# Patient Record
Sex: Female | Born: 1990 | Race: Black or African American | Hispanic: No | Marital: Single | Smoking: Former smoker
Health system: Southern US, Community
[De-identification: ages and names within clinical notes are randomized; demographics above are authoritative.]

## PROBLEM LIST (undated history)

## (undated) DIAGNOSIS — I10 Essential (primary) hypertension: Secondary | ICD-10-CM

## (undated) DIAGNOSIS — E039 Hypothyroidism, unspecified: Secondary | ICD-10-CM

## (undated) DIAGNOSIS — E119 Type 2 diabetes mellitus without complications: Secondary | ICD-10-CM

## (undated) HISTORY — PX: FEMUR SURGERY: SHX943

## (undated) HISTORY — PX: HIP FRACTURE SURGERY: SHX118

---
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
## 2010-08-16 LAB — COMPREHENSIVE METABOLIC PANEL
AST: 17 U/L (ref 0–37)
Albumin: 4.2 g/dL (ref 3.5–5.2)
Alkaline Phosphatase: 83 U/L (ref 39–117)
BUN: 4 mg/dL — ABNORMAL LOW (ref 6–23)
CO2: 27 mEq/L (ref 19–32)
Chloride: 105 mEq/L (ref 96–112)
Creatinine, Ser: 0.6 mg/dL (ref 0.4–1.2)
GFR calc non Af Amer: >60 mL/min (ref >60)
Potassium: 3.9 mEq/L (ref 3.5–5.1)
Total Bilirubin: 0.4 mg/dL (ref 0.3–1.2)

## 2010-08-16 LAB — PREGNANCY, URINE

## 2010-08-16 LAB — CBC
HCT: 39 % (ref 36.0–46.0)
Hemoglobin: 13.1 g/dL (ref 12.0–15.0)
Platelets: 272 10*3/uL (ref 150–400)
WBC: 12.5 10*3/uL — ABNORMAL HIGH (ref 4.0–10.5)

## 2010-08-16 LAB — URINALYSIS, ROUTINE W REFLEX MICROSCOPIC
Bilirubin Urine: NEGATIVE
Ketones, ur: NEGATIVE mg/dL
Nitrite: NEGATIVE
Protein, ur: NEGATIVE mg/dL

## 2010-08-16 LAB — DIFFERENTIAL
Monocytes Absolute: 0.9 10*3/uL (ref 0.1–1.0)
Monocytes Relative: 7 % (ref 3–12)
Neutro Abs: 9.3 10*3/uL — ABNORMAL HIGH (ref 1.7–7.7)

## 2010-08-16 LAB — WET PREP, GENITAL

## 2010-08-16 LAB — LIPASE, BLOOD: Lipase: 34 U/L (ref 23–300)

## 2010-08-20 LAB — URINALYSIS, ROUTINE W REFLEX MICROSCOPIC
Nitrite: NEGATIVE
Specific Gravity, Urine: 1.01 (ref 1.005–1.030)
Urobilinogen, UA: 0.2 mg/dL (ref 0.0–1.0)

## 2010-08-20 LAB — WET PREP, GENITAL

## 2010-08-20 LAB — GC/CHLAMYDIA PROBE AMP, GENITAL

## 2010-08-20 LAB — PREGNANCY, URINE

## 2010-08-20 LAB — URINE MICROSCOPIC-ADD ON

## 2011-05-30 ENCOUNTER — Encounter (HOSPITAL_BASED_OUTPATIENT_CLINIC_OR_DEPARTMENT_OTHER): Payer: Self-pay

## 2011-05-30 ENCOUNTER — Emergency Department (HOSPITAL_BASED_OUTPATIENT_CLINIC_OR_DEPARTMENT_OTHER)
Admission: EM | Admit: 2011-05-30 | Discharge: 2011-05-30 | Disposition: A | Discharge status: Home / Self Care | Payer: Medicaid Other | Attending: Emergency Medicine | Admitting: Emergency Medicine

## 2011-05-30 DIAGNOSIS — R300 Dysuria: Secondary | ICD-10-CM | POA: Insufficient documentation

## 2011-05-30 DIAGNOSIS — R319 Hematuria, unspecified: Secondary | ICD-10-CM | POA: Insufficient documentation

## 2011-05-30 DIAGNOSIS — N390 Urinary tract infection, site not specified: Secondary | ICD-10-CM | POA: Insufficient documentation

## 2011-05-30 DIAGNOSIS — F17200 Nicotine dependence, unspecified, uncomplicated: Secondary | ICD-10-CM | POA: Insufficient documentation

## 2011-05-30 LAB — URINALYSIS, ROUTINE W REFLEX MICROSCOPIC
Protein, ur: 30 mg/dL — AB
Urobilinogen, UA: 0.2 mg/dL (ref 0.0–1.0)

## 2011-05-30 LAB — URINE MICROSCOPIC-ADD ON

## 2011-05-30 LAB — PREGNANCY, URINE: Preg Test, Ur: NEGATIVE

## 2011-05-30 MED ORDER — CIPROFLOXACIN HCL 500 MG PO TABS
500.0000 mg | ORAL_TABLET | Freq: Once | ORAL | Status: AC
  Administered 2011-05-30: 500 mg via ORAL
  Filled 2011-05-30: qty 1

## 2011-05-30 MED ORDER — PHENAZOPYRIDINE HCL 100 MG PO TABS
100.0000 mg | ORAL_TABLET | Freq: Once | ORAL | Status: AC
  Administered 2011-05-30: 100 mg via ORAL
  Filled 2011-05-30: qty 1

## 2011-05-30 MED ORDER — PHENAZOPYRIDINE HCL 200 MG PO TABS: 200.0000 mg | ORAL_TABLET | Freq: Three times a day (TID) | ORAL | Status: AC

## 2011-05-30 MED ORDER — CIPROFLOXACIN HCL 500 MG PO TABS: 500.0000 mg | ORAL_TABLET | Freq: Two times a day (BID) | ORAL | Status: AC

## 2011-05-30 NOTE — Discharge Instructions (Signed)
Urinary Tract Infection Infections of the urinary tract can start in several places. A bladder infection (cystitis), a kidney infection (pyelonephritis), and a prostate infection (prostatitis) are different types of urinary tract infections (UTIs). They usually get better if treated with medicines (antibiotics) that kill germs. Take all the medicine until it is gone. You or your child may feel better in a few days, but TAKE ALL MEDICINE or the infection may not respond and may become more difficult to treat. HOME CARE INSTRUCTIONS   Drink enough water and fluids to keep the urine clear or pale yellow. Cranberry juice is especially recommended, in addition to large amounts of water.   Avoid caffeine, tea, and carbonated beverages. They tend to irritate the bladder.   Alcohol may irritate the prostate.   Only take over-the-counter or prescription medicines for pain, discomfort, or fever as directed by your caregiver.  To prevent further infections:  Empty the bladder often. Avoid holding urine for long periods of time.   After a bowel movement, women should cleanse from front to back. Use each tissue only once.   Empty the bladder before and after sexual intercourse.  FINDING OUT THE RESULTS OF YOUR TEST Not all test results are available during your visit. If your or your child's test results are not back during the visit, make an appointment with your caregiver to find out the results. Do not assume everything is normal if you have not heard from your caregiver or the medical facility. It is important for you to follow up on all test results. SEEK MEDICAL CARE IF:   There is back pain.   Your baby is older than 3 months with a rectal temperature of 100.5 F (38.1 C) or higher for more than 1 day.   Your or your child's problems (symptoms) are no better in 3 days. Return sooner if you or your child is getting worse.  SEEK IMMEDIATE MEDICAL CARE IF:   There is severe back pain or lower  abdominal pain.   You or your child develops chills.   You have a fever.   Your baby is older than 3 months with a rectal temperature of 102 F (38.9 C) or higher.   Your baby is 3 months old or younger with a rectal temperature of 100.4 F (38 C) or higher.   There is nausea or vomiting.   There is continued burning or discomfort with urination.  MAKE SURE YOU:   Understand these instructions.   Will watch your condition.   Will get help right away if you are not doing well or get worse.  Document Released: 02/08/2005 Document Revised: 01/11/2011 Document Reviewed: 09/13/2006 Exit[employer] Patient Information 2012 [employer].Urinary Tract Infection Infections of the urinary tract can start in several places. A bladder infection (cystitis), a kidney infection (pyelonephritis), and a prostate infection (prostatitis) are different types of urinary tract infections (UTIs). They usually get better if treated with medicines (antibiotics) that kill germs. Take all the medicine until it is gone. You or your child may feel better in a few days, but TAKE ALL MEDICINE or the infection may not respond and may become more difficult to treat. HOME CARE INSTRUCTIONS   Drink enough water and fluids to keep the urine clear or pale yellow. Cranberry juice is especially recommended, in addition to large amounts of water.   Avoid caffeine, tea, and carbonated beverages. They tend to irritate the bladder.   Alcohol may irritate the prostate.   Only take   over-the-counter or prescription medicines for pain, discomfort, or fever as directed by your caregiver.  To prevent further infections:  Empty the bladder often. Avoid holding urine for long periods of time.   After a bowel movement, women should cleanse from front to back. Use each tissue only once.   Empty the bladder before and after sexual intercourse.  FINDING OUT THE RESULTS OF YOUR TEST Not all test results are available during your  visit. If your or your child's test results are not back during the visit, make an appointment with your caregiver to find out the results. Do not assume everything is normal if you have not heard from your caregiver or the medical facility. It is important for you to follow up on all test results. SEEK MEDICAL CARE IF:   There is back pain.   Your baby is older than 3 months with a rectal temperature of 100.5 F (38.1 C) or higher for more than 1 day.   Your or your child's problems (symptoms) are no better in 3 days. Return sooner if you or your child is getting worse.  SEEK IMMEDIATE MEDICAL CARE IF:   There is severe back pain or lower abdominal pain.   You or your child develops chills.   You have a fever.   Your baby is older than 3 months with a rectal temperature of 102 F (38.9 C) or higher.   Your baby is 3 months old or younger with a rectal temperature of 100.4 F (38 C) or higher.   There is nausea or vomiting.   There is continued burning or discomfort with urination.  MAKE SURE YOU:   Understand these instructions.   Will watch your condition.   Will get help right away if you are not doing well or get worse.  Document Released: 02/08/2005 Document Revised: 01/11/2011 Document Reviewed: 09/13/2006 Exit[employer] Patient Information 2012 [employer]. 

## 2011-05-30 NOTE — ED Notes (Signed)
C/o dysuria/hematuria started this am

## 2011-05-30 NOTE — ED Provider Notes (Signed)
History     CSN: [id number]  Arrival date & time 05/30/11  1038   First MD Initiated Contact with Patient 05/30/11 1117      Chief Complaint  Patient presents with  . Dysuria  . Hematuria    (Consider location/radiation/quality/duration/timing/severity/associated sxs/prior treatment) HPI Comments: Patient presents with dysuria since yesterday.  She is noted a small amount of blood in her urine and a cloudy appearance to her urine.  She notes that it burned each time she urinates.  She assistant increased urgency and frequency.  She denies any abdominal pain, nausea, vomiting, flank pain or fevers.  She's not taking any medications at home for this.  There's no specific inciting or relieving factors.  Patient is a 21 y.o. female presenting with dysuria and hematuria. The history is provided by the patient. No language interpreter was used.  Dysuria  This is a new problem. The current episode started yesterday. The problem occurs every urination. The problem has not changed since onset.The quality of the pain is described as burning. The pain is mild. There has been no fever. She is sexually active. There is no history of pyelonephritis. Associated symptoms include frequency, hematuria and urgency. Pertinent negatives include no chills, no sweats, no nausea, no vomiting, no discharge, no hesitancy, no possible pregnancy and no flank pain.  Hematuria Irritative symptoms include frequency and urgency. Associated symptoms include dysuria. Pertinent negatives include no abdominal pain, chills, fever, flank pain, hesitancy, nausea or vomiting.    History reviewed. No pertinent past medical history.  History reviewed. No pertinent past surgical history.  History reviewed. No pertinent family history.  History  Substance Use Topics  . Smoking status: Current Everyday Smoker  . Smokeless tobacco: Not on file  . Alcohol Use: Yes    OB History    Grav Para Term Preterm Abortions TAB SAB  Ect Mult Living                  Review of Systems  Constitutional: Negative.  Negative for fever and chills.  HENT: Negative.   Eyes: Negative.  Negative for discharge and redness.  Respiratory: Negative.  Negative for cough and shortness of breath.   Cardiovascular: Negative.  Negative for chest pain.  Gastrointestinal: Negative.  Negative for nausea, vomiting, abdominal pain and diarrhea.  Genitourinary: Positive for dysuria, urgency, frequency and hematuria. Negative for hesitancy, flank pain, vaginal bleeding, vaginal discharge and vaginal pain.  Musculoskeletal: Negative.  Negative for back pain.  Skin: Negative.  Negative for color change and rash.  Neurological: Negative.  Negative for syncope and headaches.  Hematological: Negative.  Negative for adenopathy.  Psychiatric/Behavioral: Negative.  Negative for confusion.  All other systems reviewed and are negative.    Allergies  Review of patient's allergies indicates no known allergies.  Home Medications  No current outpatient prescriptions on file.  BP 127/79  Pulse 78  Temp(Src) 98.7 F (37.1 C) (Oral)  Resp 16  Ht 5\' 2"  (1.575 m)  Wt 98 lb (44.453 kg)  BMI 17.92 kg/m2  SpO2 100%  LMP 05/04/2011  Physical Exam  Constitutional: She is oriented to person, place, and time. She appears well-developed and well-nourished.  Non-toxic appearance. She does not have a sickly appearance.  HENT:  Head: Normocephalic and atraumatic.  Eyes: Conjunctivae, EOM and lids are normal. Pupils are equal, round, and reactive to light. No scleral icterus.  Neck: Trachea normal and normal range of motion. Neck supple.  Cardiovascular: Normal rate, regular rhythm and  normal heart sounds.   Pulmonary/Chest: Effort normal and breath sounds normal. No respiratory distress. She has no wheezes. She has no rales.  Abdominal: Soft. Normal appearance. She exhibits no distension. There is no tenderness. There is no rebound, no guarding and no  CVA tenderness.  Genitourinary:       No CVA tenderness bilaterally  Musculoskeletal: Normal range of motion.  Neurological: She is alert and oriented to person, place, and time. She has normal strength.  Skin: Skin is warm, dry and intact. No rash noted.  Psychiatric: She has a normal mood and affect. Her behavior is normal. Judgment and thought content normal.    ED Course  Procedures (including critical care time)  Results for orders placed during the hospital encounter of 05/30/11  URINALYSIS, ROUTINE W REFLEX MICROSCOPIC      Component Value Range   Color, Urine YELLOW  YELLOW    APPearance CLEAR  CLEAR    Specific Gravity, Urine 1.007  1.005 - 1.030    pH 7.0  5.0 - 8.0    Glucose, UA NEGATIVE  NEGATIVE (mg/dL)   Hgb urine dipstick LARGE (*) NEGATIVE    Bilirubin Urine NEGATIVE  NEGATIVE    Ketones, ur NEGATIVE  NEGATIVE (mg/dL)   Protein, ur 30 (*) NEGATIVE (mg/dL)   Urobilinogen, UA 0.2  0.0 - 1.0 (mg/dL)   Nitrite NEGATIVE  NEGATIVE    Leukocytes, UA SMALL (*) NEGATIVE   PREGNANCY, URINE      Component Value Range   Preg Test, Ur NEGATIVE    URINE MICROSCOPIC-ADD ON      Component Value Range   Squamous Epithelial / LPF FEW (*) RARE    WBC, UA 21-50  <3 (WBC/hpf)   RBC / HPF 11-20  <3 (RBC/hpf)   Bacteria, UA FEW (*) RARE       MDM  Patient with urinary tract infection without signs of pyelonephritis.  Patient will be started on a three-day course of ciprofloxacin and given Pyridium to assist with her symptoms.  Patient does have followup with her primary care physician as needed and understands she's not improving she should seek reevaluation.        Nat Christen, MD 05/30/11 1128

## 2011-11-02 ENCOUNTER — Emergency Department (HOSPITAL_BASED_OUTPATIENT_CLINIC_OR_DEPARTMENT_OTHER)
Admission: EM | Admit: 2011-11-02 | Discharge: 2011-11-02 | Disposition: A | Discharge status: Home / Self Care | Payer: Medicaid Other | Attending: Emergency Medicine | Admitting: Emergency Medicine

## 2011-11-02 ENCOUNTER — Encounter (HOSPITAL_BASED_OUTPATIENT_CLINIC_OR_DEPARTMENT_OTHER): Payer: Self-pay | Admitting: *Deleted

## 2011-11-02 DIAGNOSIS — J069 Acute upper respiratory infection, unspecified: Secondary | ICD-10-CM | POA: Insufficient documentation

## 2011-11-02 DIAGNOSIS — F17200 Nicotine dependence, unspecified, uncomplicated: Secondary | ICD-10-CM | POA: Insufficient documentation

## 2011-11-02 LAB — RAPID STREP SCREEN (MED CTR MEBANE ONLY): Streptococcus, Group A Screen (Direct): NEGATIVE

## 2011-11-02 MED ORDER — AZITHROMYCIN 250 MG PO TABS: 250.0000 mg | ORAL_TABLET | Freq: Every day | ORAL | Status: AC

## 2011-11-02 MED ORDER — HYDROCOD POLST-CHLORPHEN POLST 10-8 MG/5ML PO LQCR: 5.0000 mL | Freq: Two times a day (BID) | ORAL | Status: DC | PRN

## 2011-11-02 NOTE — ED Notes (Signed)
Patient states she has a two week history of sinus congestion and drainage, associated with productive cough with green secretions, fever and sore throat.

## 2011-11-02 NOTE — ED Provider Notes (Signed)
History     CSN: [id number]  Arrival date & time 11/02/11  1121   First MD Initiated Contact with Patient 11/02/11 1155      Chief Complaint  Patient presents with  . URI    (Consider location/radiation/quality/duration/timing/severity/associated sxs/prior treatment) Patient is a 21 y.o. female presenting with URI. The history is provided by the patient.  URI The primary symptoms include fever, fatigue, ear pain, sore throat, swollen glands and cough. Episode onset: 2 weeks ago and worsening. This is a new problem. The problem has been gradually worsening.  Symptoms associated with the illness include chills, plugged ear sensation, facial pain, sinus pressure and congestion.    History reviewed. No pertinent past medical history.  History reviewed. No pertinent past surgical history.  No family history on file.  History  Substance Use Topics  . Smoking status: Current Everyday Smoker -- 1.5 packs/day for 3 years    Types: Cigarettes  . Smokeless tobacco: Not on file  . Alcohol Use: Yes    OB History    Grav Para Term Preterm Abortions TAB SAB Ect Mult Living                  Review of Systems  Constitutional: Positive for fever, chills and fatigue.  HENT: Positive for ear pain, congestion, sore throat and sinus pressure.   Respiratory: Positive for cough.   All other systems reviewed and are negative.    Allergies  Review of patient's allergies indicates no known allergies.  Home Medications  No current outpatient prescriptions on file.  BP 131/81  Pulse 99  Temp 98.7 F (37.1 C) (Oral)  Resp 16  SpO2 100%  LMP 10/18/2011  Physical Exam  Nursing note and vitals reviewed. Constitutional: She is oriented to person, place, and time. She appears well-developed and well-nourished. No distress.  HENT:  Head: Normocephalic and atraumatic.       Bilateral tm's have fluid and erythema  Neck: Normal range of motion. Neck supple.  Cardiovascular: Normal rate  and regular rhythm.   No murmur heard. Pulmonary/Chest: Effort normal and breath sounds normal. No respiratory distress. She has no wheezes.  Abdominal: Soft. Bowel sounds are normal.  Musculoskeletal: Normal range of motion.  Lymphadenopathy:    She has cervical adenopathy.  Neurological: She is alert and oriented to person, place, and time.  Skin: Skin is warm and dry. She is not diaphoretic.    ED Course  Procedures (including critical care time)   Labs Reviewed  RAPID STREP SCREEN   No results found.   No diagnosis found.    MDM  Will treat with antibiotics and cough syrup.        Geoffery Lyons, MD 11/02/11 [phone]

## 2011-11-02 NOTE — Discharge Instructions (Signed)

## 2011-12-18 ENCOUNTER — Emergency Department (HOSPITAL_BASED_OUTPATIENT_CLINIC_OR_DEPARTMENT_OTHER)
Admission: EM | Admit: 2011-12-18 | Discharge: 2011-12-18 | Disposition: A | Discharge status: Home / Self Care | Payer: Medicaid Other | Attending: Emergency Medicine | Admitting: Emergency Medicine

## 2011-12-18 ENCOUNTER — Encounter (HOSPITAL_BASED_OUTPATIENT_CLINIC_OR_DEPARTMENT_OTHER): Payer: Self-pay | Admitting: *Deleted

## 2011-12-18 DIAGNOSIS — F17200 Nicotine dependence, unspecified, uncomplicated: Secondary | ICD-10-CM | POA: Insufficient documentation

## 2011-12-18 DIAGNOSIS — J039 Acute tonsillitis, unspecified: Secondary | ICD-10-CM | POA: Insufficient documentation

## 2011-12-18 MED ORDER — PENICILLIN G BENZATHINE 1200000 UNIT/2ML IM SUSP
1.2000 10*6.[IU] | Freq: Once | INTRAMUSCULAR | Status: AC
  Administered 2011-12-18: 1.2 10*6.[IU] via INTRAMUSCULAR
  Filled 2011-12-18: qty 2

## 2011-12-18 NOTE — ED Notes (Signed)
Pt. Has had injection and is waiting 20 mins for discharge due to [facility] injection

## 2011-12-18 NOTE — ED Provider Notes (Signed)
History     CSN: [id number]  Arrival date & time 12/18/11  1151   First MD Initiated Contact with Patient 12/18/11 1246      Chief Complaint  Patient presents with  . Sore Throat    (Consider location/radiation/quality/duration/timing/severity/associated sxs/prior treatment) Patient is a 21 y.o. female presenting with pharyngitis. The history is provided by the patient.  Sore Throat This is a new problem. The current episode started 1 to 4 weeks ago. The problem occurs constantly. The problem has been gradually worsening. Associated symptoms include chills, congestion and a sore throat. Pertinent negatives include no coughing, fever, myalgias, nausea or rash. Associated symptoms comments: Sore throat, worse on right, for 1 1/2 weeks. No fever. No difficulty swallowing. She has had minor congestion without significant sinus pressure. No cough or chest discomfort. No nausea. She denies sick contacts..    History reviewed. No pertinent past medical history.  History reviewed. No pertinent past surgical history.  No family history on file.  History  Substance Use Topics  . Smoking status: Current Everyday Smoker -- 1.5 packs/day for 3 years    Types: Cigarettes  . Smokeless tobacco: Not on file  . Alcohol Use: Yes    OB History    Grav Para Term Preterm Abortions TAB SAB Ect Mult Living                  Review of Systems  Constitutional: Positive for chills. Negative for fever.  HENT: Positive for congestion, sore throat and postnasal drip. Negative for sinus pressure.   Respiratory: Negative for cough.   Gastrointestinal: Negative for nausea.  Musculoskeletal: Negative for myalgias.  Skin: Negative for rash.    Allergies  Review of patient's allergies indicates no known allergies.  Home Medications   Current Outpatient Rx  Name Route Sig Dispense Refill  . HYDROCOD POLST-CPM POLST ER 10-8 MG/5ML PO LQCR Oral Take 5 mLs by mouth every 12 (twelve) hours as needed. 50  mL 0    BP 103/74  Pulse 92  Temp 99.9 F (37.7 C) (Oral)  Resp 20  Ht 5\' 2"  (1.575 m)  Wt 100 lb (45.36 kg)  BMI 18.29 kg/m2  SpO2 100%  LMP 11/17/2011  Physical Exam  Constitutional: She is oriented to person, place, and time. She appears well-developed and well-nourished.  HENT:  Head: Normocephalic.  Right Ear: External ear normal.  Left Ear: External ear normal.       Tonsillar swelling bilaterally, worse on right, with purulence on right. No evidence peritonsillar abscess. Uvula midline.  Neck: Normal range of motion. Neck supple.  Cardiovascular: Normal rate and regular rhythm.   No murmur heard. Pulmonary/Chest: Effort normal and breath sounds normal.  Musculoskeletal: Normal range of motion.  Lymphadenopathy:    She has cervical adenopathy.  Neurological: She is alert and oriented to person, place, and time.  Skin: Skin is warm and dry. No rash noted.  Psychiatric: She has a normal mood and affect.    ED Course  Procedures (including critical care time)   Labs Reviewed  RAPID STREP SCREEN   Results for orders placed during the hospital encounter of 12/18/11  RAPID STREP SCREEN      Component Value Range   Streptococcus, Group A Screen (Direct) NEGATIVE  NEGATIVE    No results found.   No diagnosis found.  1. Tonsillitis.   MDM  Will opt to treat based on duration of symptoms and appearance of throat. Encouraged recheck with her doctor  later this week.         Rodena Medin, PA-C 12/18/11 1259

## 2011-12-18 NOTE — ED Notes (Signed)
Patient states she has had swollen lymph nodes in her neck, sore throat and decrease in her ability to taste for the last 1.5 weeks.  States she has a sore throat today with white patches on her tonsils.

## 2011-12-18 NOTE — ED Provider Notes (Signed)
Medical screening examination/treatment/procedure(s) were performed by non-physician practitioner and as supervising physician I was immediately available for consultation/collaboration.  Gerhard Munch, MD 12/18/11 [phone]

## 2012-11-02 ENCOUNTER — Emergency Department (HOSPITAL_BASED_OUTPATIENT_CLINIC_OR_DEPARTMENT_OTHER)
Admission: EM | Admit: 2012-11-02 | Discharge: 2012-11-02 | Disposition: A | Discharge status: Home / Self Care | Payer: Medicaid Other | Attending: Emergency Medicine | Admitting: Emergency Medicine

## 2012-11-02 ENCOUNTER — Encounter (HOSPITAL_BASED_OUTPATIENT_CLINIC_OR_DEPARTMENT_OTHER): Payer: Self-pay

## 2012-11-02 DIAGNOSIS — S91309A Unspecified open wound, unspecified foot, initial encounter: Secondary | ICD-10-CM | POA: Insufficient documentation

## 2012-11-02 DIAGNOSIS — T148XXA Other injury of unspecified body region, initial encounter: Secondary | ICD-10-CM

## 2012-11-02 DIAGNOSIS — R000 Tachycardia, unspecified: Secondary | ICD-10-CM | POA: Insufficient documentation

## 2012-11-02 DIAGNOSIS — F17200 Nicotine dependence, unspecified, uncomplicated: Secondary | ICD-10-CM | POA: Insufficient documentation

## 2012-11-02 DIAGNOSIS — Y9389 Activity, other specified: Secondary | ICD-10-CM | POA: Insufficient documentation

## 2012-11-02 DIAGNOSIS — Y9289 Other specified places as the place of occurrence of the external cause: Secondary | ICD-10-CM | POA: Insufficient documentation

## 2012-11-02 DIAGNOSIS — Z8781 Personal history of (healed) traumatic fracture: Secondary | ICD-10-CM | POA: Insufficient documentation

## 2012-11-02 DIAGNOSIS — W268XXA Contact with other sharp object(s), not elsewhere classified, initial encounter: Secondary | ICD-10-CM | POA: Insufficient documentation

## 2012-11-02 DIAGNOSIS — Z23 Encounter for immunization: Secondary | ICD-10-CM | POA: Insufficient documentation

## 2012-11-02 MED ORDER — AMOXICILLIN-POT CLAVULANATE 875-125 MG PO TABS: 1.0000 | ORAL_TABLET | Freq: Two times a day (BID) | ORAL | Status: DC

## 2012-11-02 MED ORDER — TETANUS-DIPHTH-ACELL PERTUSSIS 5-2.5-18.5 LF-MCG/0.5 IM SUSP
0.5000 mL | Freq: Once | INTRAMUSCULAR | Status: AC
  Administered 2012-11-02: 0.5 mL via INTRAMUSCULAR
  Filled 2012-11-02: qty 0.5

## 2012-11-02 NOTE — ED Provider Notes (Signed)
History     CSN: [id number]  Arrival date & time 11/02/12  1243   First MD Initiated Contact with Patient 11/02/12 1251      Chief Complaint  Patient presents with  . Foot Injury    (Consider location/radiation/quality/duration/timing/severity/associated sxs/prior treatment) HPI Neila Teem is a 22 y.o. female who presents to the ED with right foot injury. She was walking across the the bathroom floor and stepped on a nail that was sticking up out of the floor. She was bare footed.There is a puncture wound and a laceration. She cleaned the area and put antibiotic ointment on it prior to arrival. The history was provided by the patient.  History reviewed. No pertinent past medical history.  Past Surgical History  Procedure Laterality Date  . Femur surgery    . Hip fracture surgery      History reviewed. No pertinent family history.  History  Substance Use Topics  . Smoking status: Current Every Day Smoker -- 1.50 packs/day for 3 years    Types: Cigarettes  . Smokeless tobacco: Never Used  . Alcohol Use: Yes    OB History   Grav Para Term Preterm Abortions TAB SAB Ect Mult Living                  Review of Systems  Constitutional: Negative for fever and chills.  Respiratory: Negative for shortness of breath.   Gastrointestinal: Negative for nausea and vomiting.  Skin: Positive for wound.  Psychiatric/Behavioral: The patient is not nervous/anxious.     Allergies  Review of patient's allergies indicates no known allergies.  Home Medications   Current Outpatient Rx  Name  Route  Sig  Dispense  Refill  . oxycodone (OXY-IR) 5 MG capsule   Oral   Take 5 mg by mouth every 4 (four) hours as needed.         . chlorpheniramine-HYDROcodone (TUSSIONEX PENNKINETIC ER) 10-8 MG/5ML LQCR   Oral   Take 5 mLs by mouth every 12 (twelve) hours as needed.   50 mL   0     BP 139/77  Pulse 117  Temp(Src) 98.7 F (37.1 C) (Oral)  Resp 18  Ht 5\' 2"  (1.575 m)   Wt 105 lb (47.628 kg)  BMI 19.2 kg/m2  SpO2 100%  LMP 10/07/2012  Physical Exam  Nursing note and vitals reviewed. Constitutional: She is oriented to person, place, and time. She appears well-developed and well-nourished. No distress.  HENT:  Head: Normocephalic.  Eyes: EOM are normal.  Neck: Neck supple.  Cardiovascular: Tachycardia present.   Pulmonary/Chest: Effort normal.  Abdominal: Soft. There is no tenderness.  Musculoskeletal:       Right foot: She exhibits tenderness and laceration. She exhibits normal range of motion, no swelling and no deformity.  There is a puncture wound noted to the plantar aspect of the ball of the right foot. There is also a superficial laceration in a V shape extending from the puncture site. Bleeding is controled.    Neurological: She is alert and oriented to person, place, and time. No cranial nerve deficit.  Skin: Skin is warm and dry.  Wound right foot  Psychiatric: She has a normal mood and affect. Her behavior is normal.    ED Course  Procedures (including critical care time) Soaked in betadine, peroxide and warm water, bacitracin ointment and dressing applied. Tdap booster administered.  MDM  22 y.o. female with puncture wound and superficial laceration to plantar aspect of right foot.  Will start antibiotics and she will follow up with her PCP. Patient stable for discharge home without any immediate complications.  Discussed with the patient clinical findings and plan of care and all questioned fully answered. She will return if any problems arise.          Janne Napoleon, [place] 11/02/12 1312

## 2012-11-02 NOTE — ED Notes (Signed)
Pt states that there is a nail sticking up out of the floor of her bathroom, and that she stepped on it this morning, cutting her R foot.  Pt presents with bandage to the R foot, small puncture wound to the foot.  Bleeding controlled.  Last tetanus 2008

## 2012-11-02 NOTE — ED Provider Notes (Signed)
Medical screening examination/treatment/procedure(s) were performed by non-physician practitioner and as supervising physician I was immediately available for consultation/collaboration.    Nelia Shi, MD 11/02/12 [phone]

## 2012-12-12 ENCOUNTER — Emergency Department (HOSPITAL_BASED_OUTPATIENT_CLINIC_OR_DEPARTMENT_OTHER)
Admission: EM | Admit: 2012-12-12 | Discharge: 2012-12-12 | Disposition: A | Discharge status: Home / Self Care | Payer: Medicaid Other | Attending: Emergency Medicine | Admitting: Emergency Medicine

## 2012-12-12 ENCOUNTER — Encounter (HOSPITAL_BASED_OUTPATIENT_CLINIC_OR_DEPARTMENT_OTHER): Payer: Self-pay | Admitting: *Deleted

## 2012-12-12 DIAGNOSIS — F17200 Nicotine dependence, unspecified, uncomplicated: Secondary | ICD-10-CM | POA: Insufficient documentation

## 2012-12-12 DIAGNOSIS — N764 Abscess of vulva: Secondary | ICD-10-CM | POA: Insufficient documentation

## 2012-12-12 MED ORDER — SULFAMETHOXAZOLE-TRIMETHOPRIM 800-160 MG PO TABS: 1.0000 | ORAL_TABLET | Freq: Two times a day (BID) | ORAL | Status: DC

## 2012-12-12 NOTE — ED Notes (Signed)
Abscess on her vagina.  Hx of same.

## 2012-12-12 NOTE — ED Notes (Signed)
MD at bedside. 

## 2012-12-12 NOTE — ED Provider Notes (Signed)
CSN: [id number]     Arrival date & time 12/12/12  1300 History     First MD Initiated Contact with Patient 12/12/12 1309     Chief Complaint  Patient presents with  . Abscess   (Consider location/radiation/quality/duration/timing/severity/associated sxs/prior Treatment) Patient is a 22 y.o. female presenting with abscess. The history is provided by the patient.  Abscess Location:  Ano-genital Ano-genital abscess location:  Vulva Abscess quality: draining and painful   Red streaking: no   Duration:  3 days Progression:  Worsening Context: not diabetes   Associated symptoms: no fever     History reviewed. No pertinent past medical history. Past Surgical History  Procedure Laterality Date  . Femur surgery    . Hip fracture surgery     No family history on file. History  Substance Use Topics  . Smoking status: Current Every Day Smoker -- 1.50 packs/day for 3 years    Types: Cigarettes  . Smokeless tobacco: Never Used  . Alcohol Use: Yes   OB History   Grav Para Term Preterm Abortions TAB SAB Ect Mult Living                 Review of Systems  Constitutional: Negative for fever and chills.  Genitourinary: Negative for menstrual problem (LMP less than 3 weeks ago).  Skin: Negative for color change.       Draining "boil" to labia  All other systems reviewed and are negative.    Allergies  Review of patient's allergies indicates no known allergies.  Home Medications   Current Outpatient Rx  Name  Route  Sig  Dispense  Refill  . amoxicillin-clavulanate (AUGMENTIN) 875-125 MG per tablet   Oral   Take 1 tablet by mouth every 12 (twelve) hours.   10 tablet   0   . chlorpheniramine-HYDROcodone (TUSSIONEX PENNKINETIC ER) 10-8 MG/5ML LQCR   Oral   Take 5 mLs by mouth every 12 (twelve) hours as needed.   50 mL   0   . oxycodone (OXY-IR) 5 MG capsule   Oral   Take 5 mg by mouth every 4 (four) hours as needed.         . sulfamethoxazole-trimethoprim (BACTRIM  DS,SEPTRA DS) 800-160 MG per tablet   Oral   Take 1 tablet by mouth 2 (two) times daily. One po bid x 7 days   14 tablet   0    BP 145/88  Pulse 106  Temp(Src) 98.5 F (36.9 C) (Oral)  Resp 20  Wt 105 lb (47.628 kg)  BMI 19.2 kg/m2  SpO2 100%  LMP 11/14/2012 Physical Exam  Nursing note and vitals reviewed. Constitutional: She is oriented to person, place, and time. She appears well-developed and well-nourished. No distress (reading a book in room).  HENT:  Head: Normocephalic and atraumatic.  Pulmonary/Chest: Effort normal.  Abdominal: She exhibits no distension.  Genitourinary:     Neurological: She is alert and oriented to person, place, and time.  Skin: Skin is warm.    ED Course   Procedures (including critical care time)  Labs Reviewed - No data to display No results found. 1. Labial abscess     MDM  22 year old female with a small left labial abscess. She is a less than 1 cm area of mild swelling but no fluctuance or induration. No pus is able to be expressed. She has had draining including her before arrival. No systemic symptoms such as fevers or chills. I discussed treatment options with the patient  including trying to possibly drain this early abscess versus conservative treatment with Epsom salts warm compresses and antibiotics. At this point patient would like to try the conservative treatment he'll follow up with her PCP or GYN in the next 2-3 days to monitor the treatment. I discussed strict return precautions including changing in size fevers chills or any other concerning symptoms.  Audree Camel, MD 12/12/12 [phone]

## 2013-04-20 ENCOUNTER — Encounter (HOSPITAL_BASED_OUTPATIENT_CLINIC_OR_DEPARTMENT_OTHER): Payer: Self-pay | Admitting: Emergency Medicine

## 2013-04-20 ENCOUNTER — Emergency Department (HOSPITAL_BASED_OUTPATIENT_CLINIC_OR_DEPARTMENT_OTHER)
Admission: EM | Admit: 2013-04-20 | Discharge: 2013-04-20 | Disposition: A | Discharge status: Home / Self Care | Payer: Medicaid Other | Attending: Emergency Medicine | Admitting: Emergency Medicine

## 2013-04-20 DIAGNOSIS — K115 Sialolithiasis: Secondary | ICD-10-CM | POA: Insufficient documentation

## 2013-04-20 DIAGNOSIS — F17200 Nicotine dependence, unspecified, uncomplicated: Secondary | ICD-10-CM | POA: Insufficient documentation

## 2013-04-20 NOTE — ED Provider Notes (Signed)
CSN: [id number]     Arrival date & time 04/20/13  2051 History   First MD Initiated Contact with Patient 04/20/13 2225     Chief Complaint  Patient presents with  . Mass   (Consider location/radiation/quality/duration/timing/severity/associated sxs/prior Treatment) The history is provided by the patient.   Alisha Johnson is a 22 y.o. female who presents to the ED with a lump on the side of her neck that she noticed approximately 30 minutes prior to arrival to the ED. The area started suddenly and increased in size. No difficulty swallowing. She has not been sick or had any other problems. She is up to date on all her immunizations. She denies fever, chills, nausea, vomiting or other problems.   History reviewed. No pertinent past medical history. Past Surgical History  Procedure Laterality Date  . Femur surgery    . Hip fracture surgery     No family history on file. History  Substance Use Topics  . Smoking status: Current Every Day Smoker -- 1.50 packs/day for 3 years    Types: Cigarettes  . Smokeless tobacco: Never Used  . Alcohol Use: 1.0 oz/week    2 drink(s) per week   OB History   Grav Para Term Preterm Abortions TAB SAB Ect Mult Living                 Review of Systems Negative except as stated in HPI.  Allergies  Review of patient's allergies indicates no known allergies.  Home Medications   Current Outpatient Rx  Name  Route  Sig  Dispense  Refill  . amoxicillin-clavulanate (AUGMENTIN) 875-125 MG per tablet   Oral   Take 1 tablet by mouth every 12 (twelve) hours.   10 tablet   0   . chlorpheniramine-HYDROcodone (TUSSIONEX PENNKINETIC ER) 10-8 MG/5ML LQCR   Oral   Take 5 mLs by mouth every 12 (twelve) hours as needed.   50 mL   0   . oxycodone (OXY-IR) 5 MG capsule   Oral   Take 5 mg by mouth every 4 (four) hours as needed.         . sulfamethoxazole-trimethoprim (BACTRIM DS,SEPTRA DS) 800-160 MG per tablet   Oral   Take 1 tablet by mouth 2  (two) times daily. One po bid x 7 days   14 tablet   0    BP 114/67  Pulse 71  Temp(Src) 98.9 F (37.2 C) (Oral)  Resp 16  Ht 5\' 2"  (1.575 m)  Wt 114 lb (51.71 kg)  BMI 20.85 kg/m2  SpO2 100% Physical Exam  Nursing note and vitals reviewed. Constitutional: She is oriented to person, place, and time. She appears well-developed and well-nourished. No distress.  HENT:  Right Ear: Tympanic membrane normal.  Left Ear: Tympanic membrane normal.  Nose: Nose normal.  Mouth/Throat: Uvula is midline. No trismus in the jaw. Normal dentition. No dental abscesses. No oropharyngeal exudate or posterior oropharyngeal erythema.  Swollen tender right parotid gland.   Eyes: Conjunctivae and EOM are normal.  Neck: Normal range of motion. Neck supple.  Cardiovascular: Normal rate and regular rhythm.   Pulmonary/Chest: Effort normal and breath sounds normal.  Musculoskeletal: Normal range of motion.  Neurological: She is alert and oriented to person, place, and time. No cranial nerve deficit.  Skin: Skin is warm and dry.  Psychiatric: She has a normal mood and affect. Her behavior is normal.   ED Course: Dr.  Criss Alvine in to examine the patient.  Procedures  MDM  22 y.o. female with sialolith. Discussed with the patient sucking on lemons and sour candy and all questioned fully answered. She will rturn if any problems arise.  Stable for discharge without any immediate complications.    [facility] Orlene Och, [place] 04/22/13 [phone]

## 2013-04-20 NOTE — ED Notes (Signed)
Reports she noticed lump on side of neck approx 30 mins ago. Able to swallow but states it feels funny

## 2013-04-22 NOTE — ED Provider Notes (Signed)
Medical screening examination/treatment/procedure(s) were conducted as a shared visit with non-physician practitioner(s) and myself.  I personally evaluated the patient during the encounter.  EKG Interpretation   None       Patient's "lump" most c/w sialoadenitis. Patient is well appearing and w/o systemic signs. Pain controlled. Will recommend hard candy to suck on, NSAIDs, and fluids.  Audree Camel, MD 04/22/13 [phone]

## 2013-06-20 ENCOUNTER — Encounter (HOSPITAL_BASED_OUTPATIENT_CLINIC_OR_DEPARTMENT_OTHER): Payer: Self-pay | Admitting: Emergency Medicine

## 2013-06-20 ENCOUNTER — Emergency Department (HOSPITAL_BASED_OUTPATIENT_CLINIC_OR_DEPARTMENT_OTHER)
Admission: EM | Admit: 2013-06-20 | Discharge: 2013-06-20 | Disposition: A | Discharge status: Home / Self Care | Payer: Medicaid Other | Attending: Emergency Medicine | Admitting: Emergency Medicine

## 2013-06-20 DIAGNOSIS — Z79899 Other long term (current) drug therapy: Secondary | ICD-10-CM | POA: Insufficient documentation

## 2013-06-20 DIAGNOSIS — N760 Acute vaginitis: Secondary | ICD-10-CM | POA: Insufficient documentation

## 2013-06-20 DIAGNOSIS — Z3202 Encounter for pregnancy test, result negative: Secondary | ICD-10-CM | POA: Insufficient documentation

## 2013-06-20 DIAGNOSIS — A499 Bacterial infection, unspecified: Secondary | ICD-10-CM | POA: Insufficient documentation

## 2013-06-20 DIAGNOSIS — B9689 Other specified bacterial agents as the cause of diseases classified elsewhere: Secondary | ICD-10-CM | POA: Insufficient documentation

## 2013-06-20 DIAGNOSIS — F17200 Nicotine dependence, unspecified, uncomplicated: Secondary | ICD-10-CM | POA: Insufficient documentation

## 2013-06-20 LAB — WET PREP, GENITAL
Trich, Wet Prep: NONE SEEN
Yeast Wet Prep HPF POC: NONE SEEN

## 2013-06-20 LAB — URINALYSIS, ROUTINE W REFLEX MICROSCOPIC
BILIRUBIN URINE: NEGATIVE
Glucose, UA: NEGATIVE mg/dL
HGB URINE DIPSTICK: NEGATIVE
Ketones, ur: NEGATIVE mg/dL
Leukocytes, UA: NEGATIVE
NITRITE: NEGATIVE
PH: 6 (ref 5.0–8.0)
Protein, ur: NEGATIVE mg/dL
SPECIFIC GRAVITY, URINE: 1.023 (ref 1.005–1.030)
Urobilinogen, UA: 0.2 mg/dL (ref 0.0–1.0)

## 2013-06-20 LAB — PREGNANCY, URINE: PREG TEST UR: NEGATIVE

## 2013-06-20 MED ORDER — METRONIDAZOLE 500 MG PO TABS: 500.0000 mg | ORAL_TABLET | Freq: Two times a day (BID) | ORAL | Status: DC

## 2013-06-20 NOTE — ED Provider Notes (Signed)
CSN: [id number]     Arrival date & time 06/20/13  1654 History   First MD Initiated Contact with Patient 06/20/13 1700     Chief Complaint  Patient presents with  . Vaginal Discharge   (Consider location/radiation/quality/duration/timing/severity/associated sxs/prior Treatment) Patient is a 23 y.o. female presenting with vaginal discharge. No language interpreter was used.  Vaginal Discharge Associated symptoms: no abdominal pain, no dysuria, no fever, no nausea and no vomiting    Pt is a 23 year old, G1P1 who presents with vaginal discharge. She reports that she has had a milky white vaginal discharge that smells foul for approx the last 2-3 days. She denies abdominal pain, fever, N/V/D. She denies vaginal itching, vaginal pain, dysuria, hematuria or other urinary symptoms. Her LMP was 06/15/2013 and she has periods regularly with cycles every 28-30 days.She reports condom use and denies any new sex partners.She reports that she has had BV in the past and that this feels similar.  History reviewed. No pertinent past medical history. Past Surgical History  Procedure Laterality Date  . Femur surgery    . Hip fracture surgery     No family history on file. History  Substance Use Topics  . Smoking status: Current Every Day Smoker -- 1.50 packs/day for 3 years    Types: Cigarettes  . Smokeless tobacco: Never Used  . Alcohol Use: 1.0 oz/week    2 drink(s) per week   OB History   Grav Para Term Preterm Abortions TAB SAB Ect Mult Living                 Review of Systems  Constitutional: Negative for fever.  Gastrointestinal: Negative for nausea, vomiting, abdominal pain and diarrhea.  Genitourinary: Positive for vaginal discharge. Negative for dysuria, urgency, difficulty urinating, vaginal pain and pelvic pain.  Musculoskeletal: Negative for back pain.    Allergies  Review of patient's allergies indicates no known allergies.  Home Medications   Current Outpatient Rx  Name  Route   Sig  Dispense  Refill  . amoxicillin-clavulanate (AUGMENTIN) 875-125 MG per tablet   Oral   Take 1 tablet by mouth every 12 (twelve) hours.   10 tablet   0   . chlorpheniramine-HYDROcodone (TUSSIONEX PENNKINETIC ER) 10-8 MG/5ML LQCR   Oral   Take 5 mLs by mouth every 12 (twelve) hours as needed.   50 mL   0   . metroNIDAZOLE (FLAGYL) 500 MG tablet   Oral   Take 1 tablet (500 mg total) by mouth 2 (two) times daily.   14 tablet   0   . oxycodone (OXY-IR) 5 MG capsule   Oral   Take 5 mg by mouth every 4 (four) hours as needed.         . sulfamethoxazole-trimethoprim (BACTRIM DS,SEPTRA DS) 800-160 MG per tablet   Oral   Take 1 tablet by mouth 2 (two) times daily. One po bid x 7 days   14 tablet   0    BP 123/65  Pulse 71  Temp(Src) 98.5 F (36.9 C) (Oral)  Resp 21  Ht 5\' 2"  (1.575 m)  Wt 110 lb (49.896 kg)  BMI 20.11 kg/m2  SpO2 100%  LMP 06/13/2013 Physical Exam  Vitals reviewed. Constitutional: She is oriented to person, place, and time. She appears well-developed and well-nourished. No distress.  HENT:  Head: Normocephalic and atraumatic.  Eyes: Conjunctivae and EOM are normal.  Neck: Normal range of motion. Neck supple. No JVD present. No tracheal deviation  present. No thyromegaly present.  Cardiovascular: Normal rate, regular rhythm, normal heart sounds and intact distal pulses.   Pulmonary/Chest: Effort normal and breath sounds normal.  Abdominal: Soft. Bowel sounds are normal.  Genitourinary: Vagina normal and uterus normal. There is no rash, tenderness, lesion or injury on the right labia. There is no rash, tenderness, lesion or injury on the left labia. Cervix exhibits discharge. Cervix exhibits no motion tenderness and no friability. Right adnexum displays no mass, no tenderness and no fullness. Left adnexum displays no mass, no tenderness and no fullness.  Musculoskeletal: Normal range of motion.  Neurological: She is alert and oriented to person, place,  and time.  Skin: Skin is warm and dry.  Psychiatric: She has a normal mood and affect. Her behavior is normal. Judgment and thought content normal.    ED Course  Procedures (including critical care time) Labs Review Labs Reviewed  WET PREP, GENITAL - Abnormal; Notable for the following:    Clue Cells Wet Prep HPF POC MANY (*)    WBC, Wet Prep HPF POC FEW (*)    All other components within normal limits  GC/CHLAMYDIA PROBE AMP  URINALYSIS, ROUTINE W REFLEX MICROSCOPIC  PREGNANCY, URINE   Imaging Review No results found.  EKG Interpretation   None       MDM   1. BV (bacterial vaginosis)     Urinalysis negative. Pregnancy test negative. Wet prep + for many clue cells, will treat for BV. Reassuring exam. No pelvic or abdominal pain. No CMT or discomfort with pelvic exam. Cultures sent for GC/Chlamydia and will call if positive. Prescription for metronidazole 500mg  BID x 7 days. Plan discussed with pt and she agrees. Return precautions given.     Irish Elders, NP 06/27/13 1236

## 2013-06-20 NOTE — ED Notes (Signed)
Pt c/o white malodorous discharge, denies pain, itching, burning. Pt sts she has had BV in past and "this feels like that".

## 2013-06-20 NOTE — Discharge Instructions (Signed)
Bacterial Vaginosis Bacterial vaginosis is a vaginal infection that occurs when the normal balance of bacteria in the vagina is disrupted. It results from an overgrowth of certain bacteria. This is the most common vaginal infection in women of childbearing age. Treatment is important to prevent complications, especially in pregnant women, as it can cause a premature delivery. CAUSES  Bacterial vaginosis is caused by an increase in harmful bacteria that are normally present in smaller amounts in the vagina. Several different kinds of bacteria can cause bacterial vaginosis. However, the reason that the condition develops is not fully understood. RISK FACTORS Certain activities or behaviors can put you at an increased risk of developing bacterial vaginosis, including:  Having a new sex partner or multiple sex partners.  Douching.  Using an intrauterine device (IUD) for contraception. Women do not get bacterial vaginosis from toilet seats, bedding, swimming pools, or contact with objects around them. SIGNS AND SYMPTOMS  Some women with bacterial vaginosis have no signs or symptoms. Common symptoms include:  Grey vaginal discharge.  A fishlike odor with discharge, especially after sexual intercourse.  Itching or burning of the vagina and vulva.  Burning or pain with urination. DIAGNOSIS  Your health care provider will take a medical history and examine the vagina for signs of bacterial vaginosis. A sample of vaginal fluid may be taken. Your health care provider will look at this sample under a microscope to check for bacteria and abnormal cells. A vaginal pH test may also be done.  TREATMENT  Bacterial vaginosis may be treated with antibiotic medicines. These may be given in the form of a pill or a vaginal cream. A second round of antibiotics may be prescribed if the condition comes back after treatment.  HOME CARE INSTRUCTIONS   Only take over-the-counter or prescription medicines as  directed by your health care provider.  If antibiotic medicine was prescribed, take it as directed. Make sure you finish it even if you start to feel better.  Do not have sex until treatment is completed.  Tell all sexual partners that you have a vaginal infection. They should see their health care provider and be treated if they have problems, such as a mild rash or itching.  Practice safe sex by using condoms and only having one sex partner. SEEK MEDICAL CARE IF:   Your symptoms are not improving after 3 days of treatment.  You have increased discharge or pain.  You have a fever. MAKE SURE YOU:   Understand these instructions.  Will watch your condition.  Will get help right away if you are not doing well or get worse. FOR MORE INFORMATION  Centers for Disease Control and Prevention, Division of STD Prevention: SolutionApps.co.zawww.cdc.gov/std American Sexual Health Association (ASHA): www.ashastd.org  Document Released: 05/01/2005 Document Revised: 02/19/2013 Document Reviewed: 12/11/2012 [facility] Patient Information 2014 [place], [place].  Take antibiotics as prescribed Return if symptoms worsen

## 2013-06-21 LAB — GC/CHLAMYDIA PROBE AMP
CT PROBE, AMP APTIMA: NEGATIVE
GC PROBE AMP APTIMA: NEGATIVE

## 2013-06-27 NOTE — ED Provider Notes (Signed)
Medical screening examination/treatment/procedure(s) were performed by non-physician practitioner and as supervising physician I was immediately available for consultation/collaboration.  Megan E Docherty, MD 06/27/13 2210 

## 2013-08-30 ENCOUNTER — Emergency Department (HOSPITAL_BASED_OUTPATIENT_CLINIC_OR_DEPARTMENT_OTHER)
Admission: EM | Admit: 2013-08-30 | Discharge: 2013-08-31 | Disposition: A | Discharge status: Home / Self Care | Payer: Medicaid Other | Attending: Emergency Medicine | Admitting: Emergency Medicine

## 2013-08-30 ENCOUNTER — Encounter (HOSPITAL_BASED_OUTPATIENT_CLINIC_OR_DEPARTMENT_OTHER): Payer: Self-pay | Admitting: Emergency Medicine

## 2013-08-30 DIAGNOSIS — Z3202 Encounter for pregnancy test, result negative: Secondary | ICD-10-CM | POA: Insufficient documentation

## 2013-08-30 DIAGNOSIS — R110 Nausea: Secondary | ICD-10-CM

## 2013-08-30 DIAGNOSIS — F17200 Nicotine dependence, unspecified, uncomplicated: Secondary | ICD-10-CM | POA: Insufficient documentation

## 2013-08-30 DIAGNOSIS — R109 Unspecified abdominal pain: Secondary | ICD-10-CM | POA: Insufficient documentation

## 2013-08-30 DIAGNOSIS — Z792 Long term (current) use of antibiotics: Secondary | ICD-10-CM | POA: Insufficient documentation

## 2013-08-30 LAB — URINALYSIS, ROUTINE W REFLEX MICROSCOPIC
Bilirubin Urine: NEGATIVE
GLUCOSE, UA: NEGATIVE mg/dL
HGB URINE DIPSTICK: NEGATIVE
Ketones, ur: NEGATIVE mg/dL
LEUKOCYTES UA: NEGATIVE
Nitrite: NEGATIVE
PH: 7.5 (ref 5.0–8.0)
PROTEIN: NEGATIVE mg/dL
Specific Gravity, Urine: 1.013 (ref 1.005–1.030)
Urobilinogen, UA: 0.2 mg/dL (ref 0.0–1.0)

## 2013-08-30 LAB — PREGNANCY, URINE: Preg Test, Ur: NEGATIVE

## 2013-08-30 MED ORDER — ONDANSETRON 4 MG PO TBDP
4.0000 mg | ORAL_TABLET | Freq: Once | ORAL | Status: AC
  Administered 2013-08-30: 4 mg via ORAL

## 2013-08-30 MED ORDER — ONDANSETRON 4 MG PO TBDP
ORAL_TABLET | ORAL | Status: AC
  Filled 2013-08-30: qty 1

## 2013-08-30 NOTE — ED Notes (Signed)
Reports nausea and right flank pain x 4 days- recently finished cipro for UTI

## 2013-08-30 NOTE — ED Provider Notes (Signed)
CSN: [id number]     Arrival date & time 08/30/13  2329 History  This chart was scribed for Oceanna Arruda Smitty CordsK Shailene Demonbreun-Rasch, MD by Ardelia Memsylan Malpass, ED Scribe. This patient was seen in room MH01/MH01 and the patient's care was started at 11:45 PM.   Chief Complaint  Patient presents with  . Nausea  . Flank Pain    Patient is a 23 y.o. female presenting with general illness. The history is provided by the patient. No language interpreter was used.  Illness Location:  Nausea Severity:  Mild Onset quality:  Gradual Duration:  3 days Timing:  Intermittent Progression:  Unchanged Chronicity:  New Context:  Here to rule out pregnancy Relieved by:  None tried Worsened by:  None tried Ineffective treatments:  None tried Associated symptoms: nausea   Associated symptoms: no diarrhea and no vomiting     HPI Comments: Alisha Johnson is a 23 y.o. female who presents to the Emergency Department complaining of intermittent nausea over the past few days. She expresses concern that she may be pregnant. She denies any associated episodes of emesis. She states that her LNMP was 07/15/13. She states that she is sexually active and she reports that her partner uses condoms. She states that she is not on birth control. She states that she just finished a course of Cipro for a UTI. She denies having any dysuria, increased urinary frequency or odor to her urine. She denies any sick contacts. She denies diarrhea or loose stools.    History reviewed. No pertinent past medical history. Past Surgical History  Procedure Laterality Date  . Femur surgery    . Hip fracture surgery     No family history on file. History  Substance Use Topics  . Smoking status: Current Every Day Smoker -- 1.50 packs/day for 3 years    Types: Cigarettes  . Smokeless tobacco: Never Used  . Alcohol Use: 1.0 oz/week    2 drink(s) per week   OB History   Grav Para Term Preterm Abortions TAB SAB Ect Mult Living                 Review  of Systems  Gastrointestinal: Positive for nausea. Negative for vomiting and diarrhea.  Genitourinary: Negative for dysuria and frequency.  All other systems reviewed and are negative.  Allergies  Review of patient's allergies indicates no known allergies.  Home Medications   Prior to Admission medications   Medication Sig Start Date End Date Taking? Authorizing Provider  amoxicillin-clavulanate (AUGMENTIN) 875-125 MG per tablet Take 1 tablet by mouth every 12 (twelve) hours. 11/02/12   Hope Orlene OchM Neese, NP  chlorpheniramine-HYDROcodone (TUSSIONEX PENNKINETIC ER) 10-8 MG/5ML LQCR Take 5 mLs by mouth every 12 (twelve) hours as needed. 11/02/11   Geoffery Lyonsouglas Delo, MD  metroNIDAZOLE (FLAGYL) 500 MG tablet Take 1 tablet (500 mg total) by mouth 2 (two) times daily. 06/20/13   Irish EldersKelly Walker, NP  oxycodone (OXY-IR) 5 MG capsule Take 5 mg by mouth every 4 (four) hours as needed.    Historical Provider, MD  sulfamethoxazole-trimethoprim (BACTRIM DS,SEPTRA DS) 800-160 MG per tablet Take 1 tablet by mouth 2 (two) times daily. One po bid x 7 days 12/12/12   Audree CamelScott T Goldston, MD   Triage Vitals: BP 127/80  Pulse 84  Temp(Src) 98.8 F (37.1 C) (Oral)  Resp 18  Ht 5\' 2"  (1.575 m)  Wt 114 lb (51.71 kg)  BMI 20.85 kg/m2  SpO2 100%  LMP 08/15/2013  Physical Exam  Nursing note  and vitals reviewed. Constitutional: She is oriented to person, place, and time. She appears well-developed and well-nourished. No distress.  HENT:  Head: Normocephalic and atraumatic.  Moist mucous membranes  Eyes: EOM are normal. Pupils are equal, round, and reactive to light.  Neck: Neck supple. No tracheal deviation present.  Cardiovascular: Normal rate.   Pulmonary/Chest: Effort normal. No respiratory distress.  Abdominal: Soft. Bowel sounds are normal. There is no tenderness. There is no rebound and no guarding.  Hyperactive bowel sounds. Palpable stool throughout the colon.  Musculoskeletal: Normal range of motion.   Neurological: She is alert and oriented to person, place, and time. She has normal reflexes.  Skin: Skin is warm and dry.  Psychiatric: She has a normal mood and affect. Her behavior is normal.    ED Course  Procedures (including critical care time)  DIAGNOSTIC STUDIES: Oxygen Saturation is 100% on RA, normal by my interpretation.    COORDINATION OF CARE: 11:49 PM- Discussed negative pregnancy test findings. Will await results of UA. Will also order Zofran. Pt advised of plan for treatment and pt agrees.  Medications  ondansetron (ZOFRAN-ODT) 4 MG disintegrating tablet (not administered)  ondansetron (ZOFRAN-ODT) disintegrating tablet 4 mg (4 mg Oral Given 08/30/13 2350)   Labs Review Labs Reviewed  URINALYSIS, ROUTINE W REFLEX MICROSCOPIC - Abnormal; Notable for the following:    APPearance CLOUDY (*)    All other components within normal limits  PREGNANCY, URINE    Imaging Review No results found.   EKG Interpretation None      MDM   Final diagnoses:  None    Patient states she actually just wanted a pregnancy test.  Will d/c home   I personally performed the services described in this documentation, which was scribed in my presence. The recorded information has been reviewed and is accurate.    Jasmine AweApril K Wayburn Shaler-Rasch, MD 08/31/13 [phone]

## 2013-08-31 ENCOUNTER — Encounter (HOSPITAL_BASED_OUTPATIENT_CLINIC_OR_DEPARTMENT_OTHER): Payer: Self-pay | Admitting: Emergency Medicine

## 2013-08-31 NOTE — ED Notes (Signed)
Given po fluids for po challenge

## 2013-08-31 NOTE — Discharge Instructions (Signed)
Nausea, Adult Nausea is the feeling that you have an upset stomach or have to vomit. Nausea by itself is not likely a serious concern, but it may be an early sign of more serious medical problems. As nausea gets worse, it can lead to vomiting. If vomiting develops, there is the risk of dehydration.  CAUSES   Viral infections.  Food poisoning.  Medicines.  Pregnancy.  Motion sickness.  Migraine headaches.  Emotional distress.  Severe pain from any source.  Alcohol intoxication. HOME CARE INSTRUCTIONS  Get plenty of rest.  Ask your caregiver about specific rehydration instructions.  Eat small amounts of food and sip liquids more often.  Take all medicines as told by your caregiver. SEEK MEDICAL CARE IF:  You have not improved after 2 days, or you get worse.  You have a headache. SEEK IMMEDIATE MEDICAL CARE IF:   You have a fever.  You faint.  You keep vomiting or have blood in your vomit.  You are extremely weak or dehydrated.  You have dark or bloody stools.  You have severe chest or abdominal pain. MAKE SURE YOU:  Understand these instructions.  Will watch your condition.  Will get help right away if you are not doing well or get worse. Document Released: 06/08/2004 Document Revised: 01/24/2012 Document Reviewed: 01/11/2011 [employer] Patient Information 2014 [employer].  

## 2013-09-24 ENCOUNTER — Emergency Department (HOSPITAL_BASED_OUTPATIENT_CLINIC_OR_DEPARTMENT_OTHER)
Admission: EM | Admit: 2013-09-24 | Discharge: 2013-09-24 | Disposition: A | Discharge status: Home / Self Care | Payer: Medicaid Other | Attending: Emergency Medicine | Admitting: Emergency Medicine

## 2013-09-24 ENCOUNTER — Encounter (HOSPITAL_BASED_OUTPATIENT_CLINIC_OR_DEPARTMENT_OTHER): Payer: Self-pay | Admitting: Emergency Medicine

## 2013-09-24 DIAGNOSIS — Z792 Long term (current) use of antibiotics: Secondary | ICD-10-CM | POA: Insufficient documentation

## 2013-09-24 DIAGNOSIS — O219 Vomiting of pregnancy, unspecified: Secondary | ICD-10-CM

## 2013-09-24 DIAGNOSIS — O211 Hyperemesis gravidarum with metabolic disturbance: Secondary | ICD-10-CM | POA: Insufficient documentation

## 2013-09-24 DIAGNOSIS — O99330 Smoking (tobacco) complicating pregnancy, unspecified trimester: Secondary | ICD-10-CM | POA: Insufficient documentation

## 2013-09-24 LAB — URINALYSIS, ROUTINE W REFLEX MICROSCOPIC
GLUCOSE, UA: NEGATIVE mg/dL
Hgb urine dipstick: NEGATIVE
NITRITE: NEGATIVE
PH: 6 (ref 5.0–8.0)
Protein, ur: 30 mg/dL — AB
SPECIFIC GRAVITY, URINE: 1.031 — AB (ref 1.005–1.030)
Urobilinogen, UA: 1 mg/dL (ref 0.0–1.0)

## 2013-09-24 LAB — CBC WITH DIFFERENTIAL/PLATELET
BASOS PCT: 0 % (ref 0–1)
Basophils Absolute: 0 10*3/uL (ref 0.0–0.1)
Eosinophils Absolute: 0 10*3/uL (ref 0.0–0.7)
Eosinophils Relative: 0 % (ref 0–5)
HEMATOCRIT: 37.1 % (ref 36.0–46.0)
Hemoglobin: 12.8 g/dL (ref 12.0–15.0)
LYMPHS ABS: 2.6 10*3/uL (ref 0.7–4.0)
Lymphocytes Relative: 19 % (ref 12–46)
MCH: 28.9 pg (ref 26.0–34.0)
MCHC: 34.5 g/dL (ref 30.0–36.0)
MCV: 83.7 fL (ref 78.0–100.0)
MONOS PCT: 5 % (ref 3–12)
Monocytes Absolute: 0.7 10*3/uL (ref 0.1–1.0)
NEUTROS ABS: 10.5 10*3/uL — AB (ref 1.7–7.7)
NEUTROS PCT: 76 % (ref 43–77)
PLATELETS: 246 10*3/uL (ref 150–400)
RBC: 4.43 MIL/uL (ref 3.87–5.11)
RDW: 12.6 % (ref 11.5–15.5)
WBC: 13.8 10*3/uL — AB (ref 4.0–10.5)

## 2013-09-24 LAB — BASIC METABOLIC PANEL
BUN: 4 mg/dL — ABNORMAL LOW (ref 6–23)
CO2: 23 mEq/L (ref 19–32)
Calcium: 9.7 mg/dL (ref 8.4–10.5)
Chloride: 100 mEq/L (ref 96–112)
Creatinine, Ser: 0.5 mg/dL (ref 0.50–1.10)
GFR calc Af Amer: >90 mL/min (ref >90)
GFR calc non Af Amer: >90 mL/min (ref >90)
Glucose, Bld: 90 mg/dL (ref 70–99)
Potassium: 3.5 mEq/L — ABNORMAL LOW (ref 3.7–5.3)
Sodium: 138 mEq/L (ref 137–147)

## 2013-09-24 LAB — URINE MICROSCOPIC-ADD ON

## 2013-09-24 LAB — PREGNANCY, URINE: PREG TEST UR: POSITIVE — AB

## 2013-09-24 MED ORDER — ONDANSETRON 4 MG PO TBDP
4.0000 mg | ORAL_TABLET | Freq: Once | ORAL | Status: AC
  Administered 2013-09-24: 4 mg via ORAL
  Filled 2013-09-24: qty 1

## 2013-09-24 MED ORDER — SODIUM CHLORIDE 0.9 % IV BOLUS (SEPSIS)
1000.0000 mL | Freq: Once | INTRAVENOUS | Status: AC
  Administered 2013-09-24: 1000 mL via INTRAVENOUS

## 2013-09-24 MED ORDER — ONDANSETRON 4 MG PO TBDP: 4.0000 mg | ORAL_TABLET | Freq: Three times a day (TID) | ORAL | Status: DC | PRN

## 2013-09-24 NOTE — ED Provider Notes (Signed)
CSN: [id number]     Arrival date & time 09/24/13  1750 History  This chart was scribed for Charles B. Bernette MayersSheldon, MD by Dorothey Basemania Sutton, ED Scribe. This patient was seen in room MH04/MH04 and the patient's care was started at 6:40 PM.    Chief Complaint  Patient presents with  . Morning Sickness   The history is provided by the patient. No language interpreter was used.   HPI Comments: Alisha Johnson is a 23 y.o. female who presents to the Emergency Department complaining of nausea with associated multiple episodes of emesis and a cramping pain to the lower back onset about a week ago. She states that she has not been able to tolerate either solids or liquids well. Patient received Zofran upon arrival to the ED, which she states has been able to provide a significant amount of relief. Patient reports taking an at-home pregnancy test about a week ago on 09/18/2013, which was positive. She denies vaginal bleeding, urinary symptoms. Patient reports that this is her second pregnancy and that her first child was born full-term without complication (patient is G2P1Ab0). She reports that her LMP was on 08/15/2013 and was normal. Patient has no other pertinent medical history.   History reviewed. No pertinent past medical history. Past Surgical History  Procedure Laterality Date  . Femur surgery    . Hip fracture surgery     History reviewed. No pertinent family history. History  Substance Use Topics  . Smoking status: Current Every Day Smoker -- 1.50 packs/day for 3 years    Types: Cigarettes  . Smokeless tobacco: Never Used  . Alcohol Use: 1.0 oz/week    2 drink(s) per week   OB History   Grav Para Term Preterm Abortions TAB SAB Ect Mult Living   1              Review of Systems  A complete 10 system review of systems was obtained and all systems are negative except as noted in the HPI and PMH.    Allergies  Review of patient's allergies indicates no known allergies.  Home Medications    Prior to Admission medications   Medication Sig Start Date End Date Taking? Authorizing Provider  amoxicillin-clavulanate (AUGMENTIN) 875-125 MG per tablet Take 1 tablet by mouth every 12 (twelve) hours. 11/02/12   Hope Orlene OchM Neese, NP  chlorpheniramine-HYDROcodone (TUSSIONEX PENNKINETIC ER) 10-8 MG/5ML LQCR Take 5 mLs by mouth every 12 (twelve) hours as needed. 11/02/11   Geoffery Lyonsouglas Delo, MD  metroNIDAZOLE (FLAGYL) 500 MG tablet Take 1 tablet (500 mg total) by mouth 2 (two) times daily. 06/20/13   Irish EldersKelly Walker, NP  oxycodone (OXY-IR) 5 MG capsule Take 5 mg by mouth every 4 (four) hours as needed.    Historical Provider, MD  sulfamethoxazole-trimethoprim (BACTRIM DS,SEPTRA DS) 800-160 MG per tablet Take 1 tablet by mouth 2 (two) times daily. One po bid x 7 days 12/12/12   Audree CamelScott T Goldston, MD   Triage Vitals: BP 109/65  Pulse 80  Temp(Src) 99.1 F (37.3 C) (Oral)  Resp 16  Ht 5\' 2"  (1.575 m)  Wt 112 lb (50.803 kg)  BMI 20.48 kg/m2  SpO2 100%  LMP 08/15/2013  Physical Exam  Nursing note and vitals reviewed. Constitutional: She is oriented to person, place, and time. She appears well-developed and well-nourished.  HENT:  Head: Normocephalic and atraumatic.  Eyes: EOM are normal. Pupils are equal, round, and reactive to light.  Neck: Normal range of motion. Neck supple.  Cardiovascular:  Normal rate, normal heart sounds and intact distal pulses.   Pulmonary/Chest: Effort normal and breath sounds normal.  Abdominal: Bowel sounds are normal. She exhibits no distension. There is no tenderness.  Musculoskeletal: Normal range of motion. She exhibits no edema and no tenderness.  Neurological: She is alert and oriented to person, place, and time. She has normal strength. No cranial nerve deficit or sensory deficit.  Skin: Skin is warm and dry. No rash noted.  Psychiatric: She has a normal mood and affect.    ED Course  Procedures (including critical care time)  DIAGNOSTIC STUDIES: Oxygen  Saturation is 100% on room air, normal by my interpretation.    COORDINATION OF CARE:6 6:00 PM- Ordered UA, urine microscopic, and urine pregnancy.   6:30 PM- Ordered Zofran to manage symptoms.   6:43 PM- Discussed that urine pregnancy test results are positive. Discussed that UA indicates that the patient is somewhat dehydrated, so ordered will order IV fluids. Ordered CBC and BMP. Advised patient to establish OB/GYN follow up. Discussed treatment plan with patient at bedside and patient verbalized agreement.   Labs Review Labs Reviewed  URINALYSIS, ROUTINE W REFLEX MICROSCOPIC - Abnormal; Notable for the following:    Color, Urine AMBER (*)    APPearance CLOUDY (*)    Specific Gravity, Urine 1.031 (*)    Bilirubin Urine SMALL (*)    Ketones, ur >80 (*)    Protein, ur 30 (*)    Leukocytes, UA TRACE (*)    All other components within normal limits  PREGNANCY, URINE - Abnormal; Notable for the following:    Preg Test, Ur POSITIVE (*)    All other components within normal limits  URINE MICROSCOPIC-ADD ON - Abnormal; Notable for the following:    Squamous Epithelial / LPF FEW (*)    Bacteria, UA MANY (*)    All other components within normal limits  CBC WITH DIFFERENTIAL - Abnormal; Notable for the following:    WBC 13.8 (*)    Neutro Abs 10.5 (*)    All other components within normal limits  BASIC METABOLIC PANEL - Abnormal; Notable for the following:    Potassium 3.5 (*)    BUN 4 (*)    All other components within normal limits    Imaging Review No results found.   EKG Interpretation None      MDM   Final diagnoses:  Nausea/vomiting in pregnancy    Mild dehydration from vomiting in early pregnancy. No symptoms concerning for ectopic. Given IVF with good improvement. Ob followup. Advised to start prenatal vitamins.   I personally performed the services described in this documentation, which was scribed in my presence. The recorded information has been reviewed and  is accurate.       Charles B. Bernette MayersSheldon, MD 09/24/13 2020

## 2013-09-24 NOTE — Discharge Instructions (Signed)
Morning Sickness °Morning sickness is when you feel sick to your stomach (nauseous) during pregnancy. This nauseous feeling may or may not come with vomiting. It often occurs in the morning but can be a problem any time of day. Morning sickness is most common during the first trimester, but it may continue throughout pregnancy. While morning sickness is unpleasant, it is usually harmless unless you develop severe and continual vomiting (hyperemesis gravidarum). This condition requires more intense treatment.  °CAUSES  °The cause of morning sickness is not completely known but seems to be related to normal hormonal changes that occur in pregnancy. °RISK FACTORS °You are at greater risk if you: °· Experienced nausea or vomiting before your pregnancy. °· Had morning sickness during a previous pregnancy. °· Are pregnant with more than one baby, such as twins. °TREATMENT  °Do not use any medicines (prescription, over-the-counter, or herbal) for morning sickness without first talking to your health care provider. Your health care provider may prescribe or recommend: °· Vitamin B6 supplements. °· Anti-nausea medicines. °· The herbal medicine ginger. °HOME CARE INSTRUCTIONS  °· Only take over-the-counter or prescription medicines as directed by your health care provider. °· Taking multivitamins before getting pregnant can prevent or decrease the severity of morning sickness in most women.   °· Eat a piece of dry toast or unsalted crackers before getting out of bed in the morning.   °· Eat five or six small meals a day.   °· Eat dry and bland foods (rice, baked potato ). Foods high in carbohydrates are often helpful.  °· Do not drink liquids with your meals. Drink liquids between meals.   °· Avoid greasy, fatty, and spicy foods.   °· Get someone to cook for you if the smell of any food causes nausea and vomiting.   °· If you feel nauseous after taking prenatal vitamins, take the vitamins at night or with a snack.  °· Snack  on protein foods (nuts, yogurt, cheese) between meals if you are hungry.   °· Eat unsweetened gelatins for desserts.   °· Wearing an acupressure wristband (worn for sea sickness) may be helpful.   °· Acupuncture may be helpful.   °· Do not smoke.   °· Get a humidifier to keep the air in your house free of odors.   °· Get plenty of fresh air. °SEEK MEDICAL CARE IF:  °· Your home remedies are not working, and you need medicine. °· You feel dizzy or lightheaded. °· You are losing weight. °SEEK IMMEDIATE MEDICAL CARE IF:  °· You have persistent and uncontrolled nausea and vomiting. °· You pass out (faint). °Document Released: 06/22/2006 Document Revised: 01/01/2013 Document Reviewed: 10/16/2012 °Exit[employer]® Patient Information ©2014 [employer]. ° °

## 2013-09-24 NOTE — ED Notes (Signed)
Pt c/o n/v x 1 week , positive preg test at home .

## 2013-09-24 NOTE — ED Notes (Signed)
States no longer having nausea

## 2014-03-16 ENCOUNTER — Encounter (HOSPITAL_BASED_OUTPATIENT_CLINIC_OR_DEPARTMENT_OTHER): Payer: Self-pay | Admitting: Emergency Medicine

## 2014-07-17 ENCOUNTER — Encounter (HOSPITAL_BASED_OUTPATIENT_CLINIC_OR_DEPARTMENT_OTHER): Payer: Self-pay

## 2014-07-17 ENCOUNTER — Emergency Department (HOSPITAL_BASED_OUTPATIENT_CLINIC_OR_DEPARTMENT_OTHER)
Admission: EM | Admit: 2014-07-17 | Discharge: 2014-07-17 | Disposition: A | Discharge status: Home / Self Care | Payer: Medicaid Other | Attending: Emergency Medicine | Admitting: Emergency Medicine

## 2014-07-17 DIAGNOSIS — Z720 Tobacco use: Secondary | ICD-10-CM | POA: Insufficient documentation

## 2014-07-17 DIAGNOSIS — L732 Hidradenitis suppurativa: Secondary | ICD-10-CM | POA: Insufficient documentation

## 2014-07-17 DIAGNOSIS — J020 Streptococcal pharyngitis: Secondary | ICD-10-CM

## 2014-07-17 DIAGNOSIS — I10 Essential (primary) hypertension: Secondary | ICD-10-CM | POA: Insufficient documentation

## 2014-07-17 DIAGNOSIS — J029 Acute pharyngitis, unspecified: Secondary | ICD-10-CM | POA: Diagnosis present

## 2014-07-17 HISTORY — DX: Essential (primary) hypertension: I10

## 2014-07-17 LAB — RAPID STREP SCREEN (MED CTR MEBANE ONLY): Streptococcus, Group A Screen (Direct): POSITIVE — AB

## 2014-07-17 MED ORDER — PENICILLIN G BENZATHINE 1200000 UNIT/2ML IM SUSP
1.2000 10*6.[IU] | Freq: Once | INTRAMUSCULAR | Status: AC
  Administered 2014-07-17: 1.2 10*6.[IU] via INTRAMUSCULAR
  Filled 2014-07-17: qty 2

## 2014-07-17 MED ORDER — ACETAMINOPHEN 325 MG PO TABS
ORAL_TABLET | ORAL | Status: AC
  Filled 2014-07-17: qty 2

## 2014-07-17 MED ORDER — ACETAMINOPHEN 325 MG PO TABS
650.0000 mg | ORAL_TABLET | Freq: Once | ORAL | Status: AC
  Administered 2014-07-17: 650 mg via ORAL

## 2014-07-17 MED ORDER — MUPIROCIN CALCIUM 2 % EX CREA: 1.0000 "application " | TOPICAL_CREAM | Freq: Two times a day (BID) | CUTANEOUS | Status: DC

## 2014-07-17 MED ORDER — SULFAMETHOXAZOLE-TRIMETHOPRIM 800-160 MG PO TABS: 1.0000 | ORAL_TABLET | Freq: Two times a day (BID) | ORAL | Status: AC

## 2014-07-17 NOTE — ED Notes (Addendum)
Pt reports sore throat x2 days, also c/o abscesses to bilateral axilla. Pt reports being on a BP med but cannot recall the name.

## 2014-07-17 NOTE — Discharge Instructions (Signed)
Pharyngitis Pharyngitis is redness, pain, and swelling (inflammation) of your pharynx.  CAUSES  Pharyngitis is usually caused by infection. Most of the time, these infections are from viruses (viral) and are part of a cold. However, sometimes pharyngitis is caused by bacteria (bacterial). Pharyngitis can also be caused by allergies. Viral pharyngitis may be spread from person to person by coughing, sneezing, and personal items or utensils (cups, forks, spoons, toothbrushes). Bacterial pharyngitis may be spread from person to person by more intimate contact, such as kissing.  SIGNS AND SYMPTOMS  Symptoms of pharyngitis include:   Sore throat.   Tiredness (fatigue).   Low-grade fever.   Headache.  Joint pain and muscle aches.  Skin rashes.  Swollen lymph nodes.  Plaque-like film on throat or tonsils (often seen with bacterial pharyngitis). DIAGNOSIS  Your health care provider will ask you questions about your illness and your symptoms. Your medical history, along with a physical exam, is often all that is needed to diagnose pharyngitis. Sometimes, a rapid strep test is done. Other lab tests may also be done, depending on the suspected cause.  TREATMENT  Viral pharyngitis will usually get better in 3-4 days without the use of medicine. Bacterial pharyngitis is treated with medicines that kill germs (antibiotics).  HOME CARE INSTRUCTIONS   Drink enough water and fluids to keep your urine clear or pale yellow.   Only take over-the-counter or prescription medicines as directed by your health care provider:   If you are prescribed antibiotics, make sure you finish them even if you start to feel better.   Do not take aspirin.   Get lots of rest.   Gargle with 8 oz of salt water ( tsp of salt per 1 qt of water) as often as every 1-2 hours to soothe your throat.   Throat lozenges (if you are not at risk for choking) or sprays may be used to soothe your throat. SEEK MEDICAL  CARE IF:   You have large, tender lumps in your neck.  You have a rash.  You cough up green, yellow-brown, or bloody spit. SEEK IMMEDIATE MEDICAL CARE IF:   Your neck becomes stiff.  You drool or are unable to swallow liquids.  You vomit or are unable to keep medicines or liquids down.  You have severe pain that does not go away with the use of recommended medicines.  You have trouble breathing (not caused by a stuffy nose). MAKE SURE YOU:   Understand these instructions.  Will watch your condition.  Will get help right away if you are not doing well or get worse. Document Released: 05/01/2005 Document Revised: 02/19/2013 Document Reviewed: 01/06/2013 [facility] Patient Information 2015 [place], [place]. This information is not intended to replace advice given to you by your health care provider. Make sure you discuss any questions you have with your health care provider.  Hidradenitis Suppurativa, Sweat Gland Abscess Hidradenitis suppurativa is a long lasting (chronic), uncommon disease of the sweat glands. With this, boil-like lumps and scarring develop in the groin, some times under the arms (axillae), and under the breasts. It may also uncommonly occur behind the ears, in the crease of the buttocks, and around the genitals.  CAUSES  The cause is from a blocking of the sweat glands. They then become infected. It may cause drainage and odor. It is not contagious. So it cannot be given to someone else. It most often shows up in puberty (about 69 to 24 years of age). But it may happen  much later. It is similar to acne which is a disease of the sweat glands. This condition is slightly more common in African-Americans and women. SYMPTOMS   Hidradenitis usually starts as one or more red, tender, swellings in the groin or under the arms (axilla).  Over a period of hours to days the lesions get larger. They often open to the skin surface, draining clear to yellow-colored fluid.  The  infected area heals with scarring. DIAGNOSIS  Your caregiver makes this diagnosis by looking at you. Sometimes cultures (growing germs on plates in the lab) may be taken. This is to see what germ (bacterium) is causing the infection.  TREATMENT   Topical germ killing medicine applied to the skin (antibiotics) are the treatment of choice. Antibiotics taken by mouth (systemic) are sometimes needed when the condition is getting worse or is severe.  Avoid tight-fitting clothing which traps moisture in.  Dirt does not cause hidradenitis and it is not caused by poor hygiene.  Involved areas should be cleaned daily using an antibacterial soap. Some patients find that the liquid form of Lever 2000, applied to the involved areas as a lotion after bathing, can help reduce the odor related to this condition.  Sometimes surgery is needed to drain infected areas or remove scarred tissue. Removal of large amounts of tissue is used only in severe cases.  Birth control pills may be helpful.  Oral retinoids (vitamin A derivatives) for 6 to 12 months which are effective for acne may also help this condition.  Weight loss will improve but not cure hidradenitis. It is made worse by being overweight. But the condition is not caused by being overweight.  This condition is more common in people who have had acne.  It may become worse under stress. There is no medical cure for hidradenitis. It can be controlled, but not cured. The condition usually continues for years with periods of getting worse and getting better (remission). Document Released: 12/14/2003 Document Revised: 07/24/2011 Document Reviewed: 08/01/2013 [facility] Patient Information 2015 [place]. This information is not intended to replace advice given to you by your health care provider. Make sure you discuss any questions you have with your health care provider.

## 2014-07-17 NOTE — ED Provider Notes (Signed)
CSN: [id number]     Arrival date & time 07/17/14  1944 History  This chart was scribe for Rolan BuccoMelanie Iana Buzan, MD by Angelene GiovanniEmmanuella Mensah, ED Scribe. The patient was seen in room MHH2/MHH2 and the patient's care was started at 10:23 PM.    Chief Complaint  Patient presents with  . Sore Throat   The history is provided by the patient. No language interpreter was used.   HPI Comments: Alisha Johnson is a 24 y.o. female who presents to the Emergency Department complaining of sore throat onset yesterday. She denies rhinorrhea, congestion, N/V/D. no difficulty swallowing or controlling secretions.  She also c/o abscesses to bilateral axilla. She reports that they have been draining. She adds that when she had the abscess last time, she had a staph infection. She reports that her LNMP ended about 1 week ago.   Past Medical History  Diagnosis Date  . Hypertension    Past Surgical History  Procedure Laterality Date  . Femur surgery    . Hip fracture surgery     History reviewed. No pertinent family history. History  Substance Use Topics  . Smoking status: Current Every Day Smoker -- 1.50 packs/day for 3 years    Types: Cigarettes  . Smokeless tobacco: Never Used  . Alcohol Use: 1.0 oz/week    2 Standard drinks or equivalent per week   OB History    Gravida Para Term Preterm AB TAB SAB Ectopic Multiple Living   1              Review of Systems  Constitutional: Positive for fatigue. Negative for fever, chills and diaphoresis.  HENT: Positive for sore throat. Negative for congestion, rhinorrhea and sneezing.   Eyes: Negative.   Respiratory: Negative for cough, chest tightness and shortness of breath.   Cardiovascular: Negative for chest pain and leg swelling.  Gastrointestinal: Negative for nausea, vomiting, abdominal pain, diarrhea and blood in stool.  Genitourinary: Negative for frequency, hematuria, flank pain and difficulty urinating.  Musculoskeletal: Positive for myalgias. Negative  for back pain and arthralgias.  Skin: Positive for wound. Negative for rash.  Neurological: Negative for dizziness, speech difficulty, weakness, numbness and headaches.      Allergies  Review of patient's allergies indicates no known allergies.  Home Medications   Prior to Admission medications   Medication Sig Start Date End Date Taking? Authorizing Provider  blood pressure control book MISC by Does not apply route once.   Yes Historical Provider, MD  mupirocin cream (BACTROBAN) 2 % Apply 1 application topically 2 (two) times daily. 07/17/14   Rolan BuccoMelanie Galen Russman, MD  ondansetron (ZOFRAN ODT) 4 MG disintegrating tablet Take 1 tablet (4 mg total) by mouth every 8 (eight) hours as needed for nausea or vomiting. 09/24/13   Charles B. Bernette MayersSheldon, MD  sulfamethoxazole-trimethoprim (BACTRIM DS,SEPTRA DS) 800-160 MG per tablet Take 1 tablet by mouth 2 (two) times daily. 07/17/14 07/24/14  Rolan BuccoMelanie Anabel Lykins, MD   BP 130/74 mmHg  Pulse 88  Temp(Src) 98.7 F (37.1 C) (Oral)  Resp 16  Ht 5\' 1"  (1.549 m)  Wt 127 lb (57.607 kg)  BMI 24.01 kg/m2  SpO2 99%  LMP 07/06/2014  Breastfeeding? Unknown Physical Exam  Constitutional: She is oriented to person, place, and time. She appears well-developed and well-nourished.  HENT:  Head: Normocephalic and atraumatic.  Mild erythema posterior pharynx. No exudates. Uvula is midline. No trismus  Eyes: Pupils are equal, round, and reactive to light.  Neck: Normal range of motion. Neck supple.  Cardiovascular: Normal rate, regular rhythm and normal heart sounds.   Pulmonary/Chest: Effort normal and breath sounds normal. No respiratory distress. She has no wheezes. She has no rales. She exhibits no tenderness.  Abdominal: Soft. Bowel sounds are normal. There is no tenderness. There is no rebound and no guarding.  Musculoskeletal: Normal range of motion. She exhibits no edema.  Lymphadenopathy:    She has no cervical adenopathy.  Neurological: She is alert and oriented to  person, place, and time.  Skin: Skin is warm and dry. No rash noted.  Patient has some small 1 cm indurated areas to both axilla. Each area has a central draining wound. There is no fluctuance. No surrounding cellulitis.  Psychiatric: She has a normal mood and affect.    ED Course  Procedures (including critical care time) DIAGNOSTIC STUDIES: Oxygen Saturation is 100% on RA, normal by my interpretation.    COORDINATION OF CARE: 10:27 PM- Pt advised of plan for treatment and pt agrees.    Labs Review Labs Reviewed  RAPID STREP SCREEN - Abnormal; Notable for the following:    Streptococcus, Group A Screen (Direct) POSITIVE (*)    All other components within normal limits    Imaging Review No results found.   EKG Interpretation None      MDM   Final diagnoses:  Streptococcal sore throat  Hidradenitis suppurativa    Patient was treated for strep throat with Bicillin. Her airway is intact with no signs of a peritonsillar abscess. She has some small draining wounds to both of her axilla which are consistent with hidradenitis suppurativa. I will start her on Bactrim as well as Bactroban ointment.  I personally performed the services described in this documentation, which was scribed in my presence.  The recorded information has been reviewed and considered.   Rolan Bucco, MD 07/17/14 2312

## 2015-06-22 ENCOUNTER — Inpatient Hospital Stay (HOSPITAL_BASED_OUTPATIENT_CLINIC_OR_DEPARTMENT_OTHER)
Admission: EM | Admit: 2015-06-22 | Discharge: 2015-06-24 | DRG: 759 | Disposition: A | Discharge status: Home / Self Care | Payer: Medicaid Other | Attending: Family Medicine | Admitting: Family Medicine

## 2015-06-22 ENCOUNTER — Emergency Department (HOSPITAL_BASED_OUTPATIENT_CLINIC_OR_DEPARTMENT_OTHER): Payer: Medicaid Other

## 2015-06-22 ENCOUNTER — Encounter (HOSPITAL_BASED_OUTPATIENT_CLINIC_OR_DEPARTMENT_OTHER): Payer: Self-pay

## 2015-06-22 DIAGNOSIS — E039 Hypothyroidism, unspecified: Secondary | ICD-10-CM | POA: Diagnosis present

## 2015-06-22 DIAGNOSIS — N7093 Salpingitis and oophoritis, unspecified: Secondary | ICD-10-CM | POA: Diagnosis present

## 2015-06-22 DIAGNOSIS — A549 Gonococcal infection, unspecified: Secondary | ICD-10-CM | POA: Diagnosis present

## 2015-06-22 DIAGNOSIS — A749 Chlamydial infection, unspecified: Secondary | ICD-10-CM | POA: Diagnosis present

## 2015-06-22 DIAGNOSIS — A568 Sexually transmitted chlamydial infection of other sites: Secondary | ICD-10-CM | POA: Diagnosis present

## 2015-06-22 DIAGNOSIS — E119 Type 2 diabetes mellitus without complications: Secondary | ICD-10-CM | POA: Diagnosis present

## 2015-06-22 DIAGNOSIS — I10 Essential (primary) hypertension: Secondary | ICD-10-CM | POA: Diagnosis present

## 2015-06-22 DIAGNOSIS — F17210 Nicotine dependence, cigarettes, uncomplicated: Secondary | ICD-10-CM | POA: Diagnosis present

## 2015-06-22 DIAGNOSIS — A5424 Gonococcal female pelvic inflammatory disease: Principal | ICD-10-CM | POA: Diagnosis present

## 2015-06-22 DIAGNOSIS — A5611 Chlamydial female pelvic inflammatory disease: Secondary | ICD-10-CM | POA: Diagnosis present

## 2015-06-22 DIAGNOSIS — N730 Acute parametritis and pelvic cellulitis: Secondary | ICD-10-CM

## 2015-06-22 HISTORY — DX: Type 2 diabetes mellitus without complications: E11.9

## 2015-06-22 HISTORY — DX: Hypothyroidism, unspecified: E03.9

## 2015-06-22 LAB — COMPREHENSIVE METABOLIC PANEL
ALBUMIN: 3.9 g/dL (ref 3.5–5.0)
ALT: 11 U/L — AB (ref 14–54)
AST: 15 U/L (ref 15–41)
Alkaline Phosphatase: 86 U/L (ref 38–126)
Anion gap: 10 (ref 5–15)
BILIRUBIN TOTAL: 0.9 mg/dL (ref 0.3–1.2)
BUN: 5 mg/dL — AB (ref 6–20)
CHLORIDE: 104 mmol/L (ref 101–111)
CO2: 22 mmol/L (ref 22–32)
CREATININE: 0.53 mg/dL (ref 0.44–1.00)
Calcium: 8.8 mg/dL — ABNORMAL LOW (ref 8.9–10.3)
GFR calc Af Amer: >60 mL/min (ref >60)
GLUCOSE: 90 mg/dL (ref 65–99)
Potassium: 3.7 mmol/L (ref 3.5–5.1)
Sodium: 136 mmol/L (ref 135–145)
TOTAL PROTEIN: 7.4 g/dL (ref 6.5–8.1)

## 2015-06-22 LAB — CBC WITH DIFFERENTIAL/PLATELET
BASOS ABS: 0 10*3/uL (ref 0.0–0.1)
Basophils Relative: 0 %
EOS PCT: 0 %
Eosinophils Absolute: 0 10*3/uL (ref 0.0–0.7)
HEMATOCRIT: 37 % (ref 36.0–46.0)
Hemoglobin: 12 g/dL (ref 12.0–15.0)
LYMPHS PCT: 16 %
Lymphs Abs: 3 10*3/uL (ref 0.7–4.0)
MCH: 26.3 pg (ref 26.0–34.0)
MCHC: 32.4 g/dL (ref 30.0–36.0)
MCV: 81.1 fL (ref 78.0–100.0)
MONO ABS: 0.8 10*3/uL (ref 0.1–1.0)
MONOS PCT: 4 %
Neutro Abs: 15.8 10*3/uL — ABNORMAL HIGH (ref 1.7–7.7)
Neutrophils Relative %: 80 %
PLATELETS: 403 10*3/uL — AB (ref 150–400)
RBC: 4.56 MIL/uL (ref 3.87–5.11)
RDW: 15.3 % (ref 11.5–15.5)
WBC: 19.6 10*3/uL — ABNORMAL HIGH (ref 4.0–10.5)

## 2015-06-22 LAB — PREGNANCY, URINE: Preg Test, Ur: NEGATIVE

## 2015-06-22 LAB — URINALYSIS, ROUTINE W REFLEX MICROSCOPIC
Bilirubin Urine: NEGATIVE
GLUCOSE, UA: NEGATIVE mg/dL
Ketones, ur: NEGATIVE mg/dL
NITRITE: NEGATIVE
PH: 7 (ref 5.0–8.0)
Protein, ur: NEGATIVE mg/dL
SPECIFIC GRAVITY, URINE: 1.012 (ref 1.005–1.030)

## 2015-06-22 LAB — WET PREP, GENITAL
Sperm: NONE SEEN
Trich, Wet Prep: NONE SEEN
Yeast Wet Prep HPF POC: NONE SEEN

## 2015-06-22 LAB — URINE MICROSCOPIC-ADD ON

## 2015-06-22 LAB — LIPASE, BLOOD: LIPASE: 17 U/L (ref 11–51)

## 2015-06-22 LAB — I-STAT CG4 LACTIC ACID, ED: LACTIC ACID, VENOUS: 0.44 mmol/L — AB (ref 0.5–2.0)

## 2015-06-22 MED ORDER — PANTOPRAZOLE SODIUM 40 MG IV SOLR
40.0000 mg | Freq: Once | INTRAVENOUS | Status: AC
  Administered 2015-06-22: 40 mg via INTRAVENOUS
  Filled 2015-06-22: qty 40

## 2015-06-22 MED ORDER — DOXYCYCLINE HYCLATE 100 MG PO TABS
100.0000 mg | ORAL_TABLET | Freq: Once | ORAL | Status: AC
  Administered 2015-06-22: 100 mg via ORAL
  Filled 2015-06-22: qty 1

## 2015-06-22 MED ORDER — SODIUM CHLORIDE 0.9 % IV BOLUS (SEPSIS)
1000.0000 mL | Freq: Once | INTRAVENOUS | Status: AC
  Administered 2015-06-22: 1000 mL via INTRAVENOUS

## 2015-06-22 MED ORDER — KETOROLAC TROMETHAMINE 30 MG/ML IJ SOLN
30.0000 mg | Freq: Once | INTRAMUSCULAR | Status: AC
  Administered 2015-06-22: 30 mg via INTRAVENOUS
  Filled 2015-06-22: qty 1

## 2015-06-22 MED ORDER — GI COCKTAIL ~~LOC~~
30.0000 mL | Freq: Once | ORAL | Status: AC
  Administered 2015-06-22: 30 mL via ORAL
  Filled 2015-06-22: qty 30

## 2015-06-22 MED ORDER — ONDANSETRON HCL 4 MG/2ML IJ SOLN
INTRAMUSCULAR | Status: AC
  Filled 2015-06-22: qty 2

## 2015-06-22 MED ORDER — ONDANSETRON HCL 4 MG/2ML IJ SOLN
4.0000 mg | Freq: Once | INTRAMUSCULAR | Status: AC
  Administered 2015-06-22: 4 mg via INTRAVENOUS

## 2015-06-22 MED ORDER — IOHEXOL 300 MG/ML  SOLN
25.0000 mL | Freq: Once | INTRAMUSCULAR | Status: AC | PRN
  Administered 2015-06-22: 25 mL via ORAL

## 2015-06-22 MED ORDER — DOXYCYCLINE HYCLATE 100 MG IV SOLR: 100.0000 mg | Freq: Once | INTRAVENOUS | Status: DC

## 2015-06-22 MED ORDER — IOHEXOL 300 MG/ML  SOLN
100.0000 mL | Freq: Once | INTRAMUSCULAR | Status: AC | PRN
  Administered 2015-06-22: 100 mL via INTRAVENOUS

## 2015-06-22 MED ORDER — DEXTROSE 5 % IV SOLN
2.0000 g | Freq: Once | INTRAVENOUS | Status: AC
  Administered 2015-06-22: 2 g via INTRAVENOUS
  Filled 2015-06-22: qty 2

## 2015-06-22 NOTE — ED Notes (Signed)
abd pain started last night-seen for same at [facility] ED last month-given rx immodium-last BM today-denies n/v/d-vaginal bleeding x 2 months-odor x 2 weeks

## 2015-06-22 NOTE — ED Notes (Signed)
Pt returned from ct, iv fluids started, per ct tech pt was complaining of itching, upon my assesment at 2050, pt stated that itching had stopped and she was feeling much better and ready to go home

## 2015-06-22 NOTE — ED Notes (Signed)
[facility] Nurse ware pt is coming by POV

## 2015-06-22 NOTE — ED Notes (Signed)
Pt drinking contrast. 

## 2015-06-22 NOTE — ED Provider Notes (Signed)
CSN: [id number]     Arrival date & time 06/22/15  1545 History   First MD Initiated Contact with Patient 06/22/15 1729     Chief Complaint  Patient presents with  . Abdominal Pain     (Consider location/radiation/quality/duration/timing/severity/associated sxs/prior Treatment) Patient is a 25 y.o. female presenting with abdominal pain. The history is provided by the patient.  Abdominal Pain Pain location:  Generalized Pain quality comment:  Sore Pain radiates to:  Does not radiate Pain severity:  Moderate Onset quality:  Sudden Duration:  1 day Timing:  Constant Progression:  Worsening Chronicity:  Recurrent Context: alcohol use   Context: not diet changes, not eating, not previous surgeries, not recent travel and not sick contacts   Relieved by:  Bowel activity and OTC medications Worsened by:  Palpation, movement and coughing Ineffective treatments:  None tried Associated symptoms: constipation, diarrhea and vaginal discharge   Associated symptoms: no chest pain, no chills, no cough, no dysuria, no fever, no hematemesis, no hematochezia, no hematuria, no melena, no nausea, no shortness of breath, no vaginal bleeding and no vomiting   Risk factors: has not had multiple surgeries, not pregnant and no recent hospitalization    Alisha Johnson is a 25 y.o. female with PMH significant for HTN, Hypothyroid, DM who presents with 1 day hx of generalized, constant, moderate abdominal pain.  She is unable to describe the pain, but states that "it's just really sore".  Aggravating factors include walking, breathing, movement.  Relieving factors include BMs.  It is not associated with eating.  Last BM today, and was normal for her.  Associated symptoms include abdominal distention, alternating diarrhea, and constipation, vaginal odor, and vaginal discharge.  Denies fever, N/V, melena, hematochezia, dysuria, or hematuria.  She is currently on her period.  She is in a monogamous relationship and  uses condoms.  No hx of abdominal surgeries.  Reports occassional marijuana use.  Smokes 0.5 ppd.  Drinks "about 2 beers a night".   Patient reports she was seen at [facility] about a month ago for the same symptoms and sent home with immodium.  She states the immodium does alleviate her diarrhea.    Past Medical History  Diagnosis Date  . Hypertension   . Hypothyroid   . Diabetes mellitus without complication [facility])    Past Surgical History  Procedure Laterality Date  . Femur surgery    . Hip fracture surgery     No family history on file. Social History  Substance Use Topics  . Smoking status: Current Every Day Smoker -- 1.50 packs/day for 3 years    Types: Cigarettes  . Smokeless tobacco: Never Used  . Alcohol Use: Yes     Comment: daily   OB History    Gravida Para Term Preterm AB TAB SAB Ectopic Multiple Living   1              Review of Systems  Constitutional: Negative for fever and chills.  Respiratory: Negative for cough and shortness of breath.   Cardiovascular: Negative for chest pain.  Gastrointestinal: Positive for abdominal pain, diarrhea and constipation. Negative for nausea, vomiting, blood in stool, melena, hematochezia and hematemesis.  Genitourinary: Positive for frequency and vaginal discharge. Negative for dysuria, hematuria and vaginal bleeding.  All other systems reviewed and are negative.     Allergies  Iohexol  Home Medications   Prior to Admission medications   Medication Sig Start Date End Date Taking? Authorizing Provider  METFORMIN HCL PO Take  by mouth.   Yes Historical Provider, MD   BP 122/78 mmHg  Pulse 100  Temp(Src) 98.8 F (37.1 C) (Oral)  Resp 18  Ht  (1.575 m)  Wt 64.411 kg  BMI 25.97 kg/m2  SpO2 99%  LMP 05/31/2015 Physical Exam  Constitutional: She is oriented to person, place, and time. She appears well-developed and well-nourished.  Non-toxic appearance. She does not have a sickly appearance. She does not appear ill.   HENT:  Head: Normocephalic and atraumatic.  Mouth/Throat: Oropharynx is clear and moist.  Eyes: Conjunctivae are normal. Pupils are equal, round, and reactive to light.  Neck: Normal range of motion. Neck supple.  Cardiovascular: Normal rate, regular rhythm and normal heart sounds.   No murmur heard. Pulmonary/Chest: Effort normal and breath sounds normal. No accessory muscle usage or stridor. No respiratory distress. She has no wheezes. She has no rhonchi. She has no rales.  Abdominal: Soft. Bowel sounds are normal. She exhibits no distension. There is generalized tenderness. There is guarding (voluntary). There is no rigidity and no rebound.  Genitourinary: Uterus normal. There is no rash, tenderness or lesion on the right labia. There is no rash, tenderness or lesion on the left labia. Cervix exhibits discharge. Cervix exhibits no motion tenderness. Right adnexum displays no tenderness. Left adnexum displays tenderness.  Musculoskeletal: Normal range of motion.  Lymphadenopathy:    She has no cervical adenopathy.  Neurological: She is alert and oriented to person, place, and time.  Speech clear without dysarthria.  Skin: Skin is warm and dry.  Psychiatric: She has a normal mood and affect. Her behavior is normal.    ED Course  Procedures (including critical care time) Labs Review Labs Reviewed  WET PREP, GENITAL - Abnormal; Notable for the following:    Clue Cells Wet Prep HPF POC PRESENT (*)    WBC, Wet Prep HPF POC MANY (*)    All other components within normal limits  URINALYSIS, ROUTINE W REFLEX MICROSCOPIC (NOT AT [facility]) - Abnormal; Notable for the following:    APPearance CLOUDY (*)    Hgb urine dipstick TRACE (*)    Leukocytes, UA TRACE (*)    All other components within normal limits  COMPREHENSIVE METABOLIC PANEL - Abnormal; Notable for the following:    BUN 5 (*)    Calcium 8.8 (*)    ALT 11 (*)    All other components within normal limits  CBC WITH  DIFFERENTIAL/PLATELET - Abnormal; Notable for the following:    WBC 19.6 (*)    Platelets 403 (*)    Neutro Abs 15.8 (*)    All other components within normal limits  URINE MICROSCOPIC-ADD ON - Abnormal; Notable for the following:    Squamous Epithelial / LPF 0-5 (*)    Bacteria, UA RARE (*)    All other components within normal limits  I-STAT CG4 LACTIC ACID, ED - Abnormal; Notable for the following:    Lactic Acid, Venous 0.44 (*)    All other components within normal limits  PREGNANCY, URINE  LIPASE, BLOOD  RPR  HIV ANTIBODY (ROUTINE TESTING)  GC/CHLAMYDIA PROBE AMP ([place]) NOT AT [facility]    Imaging Review Ct Abdomen Pelvis W Contrast  06/22/2015  CLINICAL DATA:  Lower abdominal pain and diarrhea for 1 month. Subsequent encounter. EXAM: CT ABDOMEN AND PELVIS WITH CONTRAST TECHNIQUE: Multidetector CT imaging of the abdomen and pelvis was performed using the standard protocol following bolus administration of intravenous contrast. CONTRAST:  25 mL OMNIPAQUE  IOHEXOL 300 MG/ML SOLN, 100 mL OMNIPAQUE IOHEXOL 300 MG/ML SOLN COMPARISON:  CT abdomen and pelvis 05/30/2015. FINDINGS: Lung bases are clear.  No pleural or pericardial effusion. The gallbladder, liver, spleen, adrenal glands, pancreas, biliary tree and kidneys all appear normal. The left fallopian tube is mildly dilated with fluid within it. A multicystic structure with hyperenhancement is seen in the left ovary. The uterus and right ovary are unremarkable. Urinary bladder appears normal. There is a small volume of free pelvic fluid which is greater than typically seen in physiologic change. The stomach, small bowel and appendix appear normal. There is mild stranding in omental fat. No lymphadenopathy is seen. No lytic or sclerotic bony lesion is identified. Fixation hardware in the proximal left femur is noted. IMPRESSION: Findings most suspicious for tubo-ovarian abscess on the left. No other acute abnormality is identified.  Electronically Signed   By: Drusilla Kanner M.D.   On: 06/22/2015 21:12   I have personally reviewed and evaluated these images and lab results as part of my medical decision-making.   EKG Interpretation None      MDM   Final diagnoses:  Left tubo-ovarian abscess    Patient presents with generalized abdominal pain x 1 day with nausea and alternating diarrhea and constipation.  No emesis or fever.  VSS, NAD.  Patient reports she was seen a month ago for the same and sent home with immodium.  On exam, heart RRR, lungs CTAB, abdomen soft with generalized TTP with voluntary guarding. Will obtain laba and CT abdomen/pelvis. Patient given toradol, gi cocktail, and protonix.  Labs remarkable for leukocytosis, WBC 19.6.  Wet prep shows clue cells and many WBCs.   CT abdomen/pelvis remarkable for tubo-ovarian abscess on the left.  Will consult OB/GYN and start IV cefoxitin and PO doxycycline. Lactic acid 0.44.  Per Dr. Shawnie Pons, will admit patient to [facility] for IV abx.  Discussed this with the patient, and she states that she needs to go home first to get her son taken care of.  I recommended our transportation; however, she deferred.  VSS, NAD, she is hemodynamically stable.  I discussed the importance of going to [facility] immediately after she takes care of her son and drops him off.  Discussed the importance of IV abx and the dangers of an untreated ongoing tubo-ovarian abscess.  Patient verbally agrees and acknowledges this.  Patient stable for discharge and direct admit to [facility], Dr. Shawnie Pons admitting physician.  Case has been discussed with Dr. Dalene Seltzer who agrees with the above plan.      Cheri Fowler, PA-C 06/22/15 2215  Alvira Monday, MD 06/24/15 [phone]

## 2015-06-23 ENCOUNTER — Encounter (HOSPITAL_COMMUNITY): Payer: Self-pay | Admitting: *Deleted

## 2015-06-23 DIAGNOSIS — E039 Hypothyroidism, unspecified: Secondary | ICD-10-CM | POA: Diagnosis present

## 2015-06-23 DIAGNOSIS — N12 Tubulo-interstitial nephritis, not specified as acute or chronic: Secondary | ICD-10-CM | POA: Insufficient documentation

## 2015-06-23 DIAGNOSIS — N7093 Salpingitis and oophoritis, unspecified: Secondary | ICD-10-CM | POA: Diagnosis present

## 2015-06-23 DIAGNOSIS — I10 Essential (primary) hypertension: Secondary | ICD-10-CM | POA: Diagnosis present

## 2015-06-23 DIAGNOSIS — E119 Type 2 diabetes mellitus without complications: Secondary | ICD-10-CM | POA: Insufficient documentation

## 2015-06-23 DIAGNOSIS — F17210 Nicotine dependence, cigarettes, uncomplicated: Secondary | ICD-10-CM | POA: Diagnosis present

## 2015-06-23 DIAGNOSIS — A5611 Chlamydial female pelvic inflammatory disease: Secondary | ICD-10-CM | POA: Diagnosis present

## 2015-06-23 DIAGNOSIS — A5424 Gonococcal female pelvic inflammatory disease: Secondary | ICD-10-CM | POA: Diagnosis present

## 2015-06-23 DIAGNOSIS — A568 Sexually transmitted chlamydial infection of other sites: Secondary | ICD-10-CM | POA: Diagnosis present

## 2015-06-23 LAB — GC/CHLAMYDIA PROBE AMP (~~LOC~~) NOT AT ARMC
Chlamydia: POSITIVE — AB
Neisseria Gonorrhea: POSITIVE — AB

## 2015-06-23 LAB — RPR: RPR: NONREACTIVE

## 2015-06-23 LAB — HIV ANTIBODY (ROUTINE TESTING W REFLEX): HIV SCREEN 4TH GENERATION: NONREACTIVE

## 2015-06-23 MED ORDER — NICOTINE 21 MG/24HR TD PT24
21.0000 mg | MEDICATED_PATCH | Freq: Every day | TRANSDERMAL | Status: DC
  Administered 2015-06-23 – 2015-06-24 (×2): 21 mg via TRANSDERMAL
  Filled 2015-06-23 (×3): qty 1

## 2015-06-23 MED ORDER — DEXTROSE 5 % IV SOLN
2.0000 g | Freq: Four times a day (QID) | INTRAVENOUS | Status: DC
  Administered 2015-06-23 – 2015-06-24 (×6): 2 g via INTRAVENOUS
  Filled 2015-06-23 (×9): qty 2

## 2015-06-23 MED ORDER — METRONIDAZOLE 500 MG PO TABS
500.0000 mg | ORAL_TABLET | Freq: Two times a day (BID) | ORAL | Status: DC
  Administered 2015-06-23 – 2015-06-24 (×3): 500 mg via ORAL
  Filled 2015-06-23 (×5): qty 1

## 2015-06-23 MED ORDER — GUAIFENESIN 100 MG/5ML PO SOLN: 15.0000 mL | ORAL | Status: DC | PRN

## 2015-06-23 MED ORDER — DOXYCYCLINE HYCLATE 100 MG IV SOLR
100.0000 mg | Freq: Two times a day (BID) | INTRAVENOUS | Status: DC
  Administered 2015-06-23 – 2015-06-24 (×3): 100 mg via INTRAVENOUS
  Filled 2015-06-23 (×4): qty 100

## 2015-06-23 MED ORDER — IBUPROFEN 600 MG PO TABS
600.0000 mg | ORAL_TABLET | Freq: Four times a day (QID) | ORAL | Status: DC | PRN
  Administered 2015-06-23: 600 mg via ORAL
  Filled 2015-06-23: qty 1

## 2015-06-23 MED ORDER — MENTHOL 3 MG MT LOZG: 1.0000 | LOZENGE | OROMUCOSAL | Status: DC | PRN

## 2015-06-23 MED ORDER — SODIUM CHLORIDE 0.9 % IV SOLN: 250.0000 mL | INTRAVENOUS | Status: DC | PRN

## 2015-06-23 MED ORDER — SODIUM CHLORIDE 0.9% FLUSH: 3.0000 mL | Freq: Two times a day (BID) | INTRAVENOUS | Status: DC

## 2015-06-23 MED ORDER — SODIUM CHLORIDE 0.9% FLUSH: 3.0000 mL | INTRAVENOUS | Status: DC | PRN

## 2015-06-23 MED ORDER — INFLUENZA VAC SPLIT QUAD 0.5 ML IM SUSY
0.5000 mL | PREFILLED_SYRINGE | INTRAMUSCULAR | Status: AC
  Administered 2015-06-24: 0.5 mL via INTRAMUSCULAR

## 2015-06-23 MED ORDER — ALUM & MAG HYDROXIDE-SIMETH 200-200-20 MG/5ML PO SUSP: 30.0000 mL | ORAL | Status: DC | PRN

## 2015-06-23 MED ORDER — OXYCODONE-ACETAMINOPHEN 5-325 MG PO TABS
1.0000 | ORAL_TABLET | Freq: Four times a day (QID) | ORAL | Status: DC | PRN
  Administered 2015-06-23 (×2): 1 via ORAL
  Administered 2015-06-23: 2 via ORAL
  Filled 2015-06-23 (×2): qty 1
  Filled 2015-06-23: qty 2

## 2015-06-23 MED ORDER — DEXTROSE 5 % IV SOLN: 2.0000 g | Freq: Four times a day (QID) | INTRAVENOUS | Status: DC

## 2015-06-23 NOTE — H&P (Signed)
Alisha Johnson is an 25 y.o. G1P0 female.   Chief Complaint: abdominal pain HPI: Reports 1 day h/o increasing lower abdominal pain. On her cycle. Some vaginal discharge. Notes constipation, diarrhea. CT shows dilated left fallopian tube and TOA on left.  Past Medical History  Diagnosis Date  . Hypertension   . Hypothyroid   . Diabetes mellitus without complication [facility])     Past Surgical History  Procedure Laterality Date  . Femur surgery    . Hip fracture surgery      History reviewed. No pertinent family history. Social History:  reports that she has been smoking Cigarettes.  She has a 4.5 pack-year smoking history. She has never used smokeless tobacco. She reports that she drinks alcohol. She reports that she uses illicit drugs (Marijuana).  Allergies:  Allergies  Allergen Reactions  . Iohexol Itching    No current facility-administered medications on file prior to encounter.   No current outpatient prescriptions on file prior to encounter.    A comprehensive review of systems was negative.  Blood pressure 104/48, pulse 76, temperature 98.8 F (37.1 C), temperature source Oral, resp. rate 14, height  (1.575 m), weight 137 lb 12 oz (62.483 kg), last menstrual period 05/31/2015, SpO2 99 %, unknown if currently breastfeeding. BP 104/48 mmHg  Pulse 76  Temp(Src) 98.8 F (37.1 C) (Oral)  Resp 14  Ht  (1.575 m)  Wt 137 lb 12 oz (62.483 kg)  BMI 25.19 kg/m2  SpO2 99%  LMP 05/31/2015 General appearance: alert, cooperative and appears stated age Neck: supple, symmetrical, trachea midline Lungs: normal effort Heart: regular rate and rhythm Abdomen: soft, tender to palpation in lower quadrants and epigastrum. + guarding, no rebound Extremities: extremities normal, atraumatic, no cyanosis or edema Skin: Skin color, texture, turgor normal. No rashes or lesions Neurologic: Grossly normal   Lab Results  Component Value Date   WBC 19.6* 06/22/2015   HGB  12.0 06/22/2015   HCT 37.0 06/22/2015   MCV 81.1 06/22/2015   PLT 403* 06/22/2015   Lab Results  Component Value Date   PREGTESTUR NEGATIVE 06/22/2015   Yeast Wet Prep HPF POC NONE SEEN  NONE SEEN   Trich, Wet Prep NONE SEEN  NONE SEEN   Clue Cells Wet Prep HPF POC NONE SEEN  PRESENT (A)   WBC, Wet Prep HPF POC NONE SEEN  MANY (A)   Sperm  NONE SEEN         Ct Abdomen Pelvis W Contrast  06/22/2015  CLINICAL DATA:  Lower abdominal pain and diarrhea for 1 month. Subsequent encounter. EXAM: CT ABDOMEN AND PELVIS WITH CONTRAST TECHNIQUE: Multidetector CT imaging of the abdomen and pelvis was performed using the standard protocol following bolus administration of intravenous contrast. CONTRAST:  25 mL OMNIPAQUE IOHEXOL 300 MG/ML SOLN, 100 mL OMNIPAQUE IOHEXOL 300 MG/ML SOLN COMPARISON:  CT abdomen and pelvis 05/30/2015. FINDINGS: Lung bases are clear.  No pleural or pericardial effusion. The gallbladder, liver, spleen, adrenal glands, pancreas, biliary tree and kidneys all appear normal. The left fallopian tube is mildly dilated with fluid within it. A multicystic structure with hyperenhancement is seen in the left ovary. The uterus and right ovary are unremarkable. Urinary bladder appears normal. There is a small volume of free pelvic fluid which is greater than typically seen in physiologic change. The stomach, small bowel and appendix appear normal. There is mild stranding in omental fat. No lymphadenopathy is seen. No lytic or sclerotic bony lesion is identified. Fixation hardware  in the proximal left femur is noted. IMPRESSION: Findings most suspicious for tubo-ovarian abscess on the left. No other acute abnormality is identified. Electronically Signed   By: Drusilla Kanner M.D.   On: 06/22/2015 21:12     Assessment/Plan Principal Problem:   Tubo-ovarian abscess   IV mefoxitin and Doxy. Will add Flagyl for BV. Continue inpatient until improved.    Sabrena Gavitt S 06/23/2015, 7:17  AM

## 2015-06-23 NOTE — Progress Notes (Signed)
Admission nutrition screen triggered for unintentional weight loss > 10 lbs within the last month. . Patients chart reviewed and assessed  for nutritional risk. There is documentation of a 10 lb weight gain over the past year. Patient is determined to be at low nutrition  risk. If it is determined that the pt has a poor appetite, order Ensure BID  Alisha Johnson M.Odis Luster LDN Neonatal Nutrition Support Specialist/RD III Pager [phone]      Phone [phone]

## 2015-06-24 ENCOUNTER — Encounter: Payer: Self-pay | Admitting: Obstetrics & Gynecology

## 2015-06-24 ENCOUNTER — Inpatient Hospital Stay (HOSPITAL_COMMUNITY): Payer: Medicaid Other

## 2015-06-24 DIAGNOSIS — N7093 Salpingitis and oophoritis, unspecified: Secondary | ICD-10-CM | POA: Diagnosis present

## 2015-06-24 DIAGNOSIS — A749 Chlamydial infection, unspecified: Secondary | ICD-10-CM | POA: Diagnosis present

## 2015-06-24 DIAGNOSIS — A549 Gonococcal infection, unspecified: Secondary | ICD-10-CM | POA: Diagnosis present

## 2015-06-24 LAB — CBC WITH DIFFERENTIAL/PLATELET
BASOS PCT: 0 %
Basophils Absolute: 0 10*3/uL (ref 0.0–0.1)
EOS ABS: 0.1 10*3/uL (ref 0.0–0.7)
Eosinophils Relative: 1 %
HCT: 35.1 % — ABNORMAL LOW (ref 36.0–46.0)
HEMOGLOBIN: 11.5 g/dL — AB (ref 12.0–15.0)
LYMPHS ABS: 2.3 10*3/uL (ref 0.7–4.0)
Lymphocytes Relative: 22 %
MCH: 26.9 pg (ref 26.0–34.0)
MCHC: 32.8 g/dL (ref 30.0–36.0)
MCV: 82.2 fL (ref 78.0–100.0)
Monocytes Absolute: 0.2 10*3/uL (ref 0.1–1.0)
Monocytes Relative: 2 %
NEUTROS PCT: 75 %
Neutro Abs: 7.7 10*3/uL (ref 1.7–7.7)
Platelets: 406 10*3/uL — ABNORMAL HIGH (ref 150–400)
RBC: 4.27 MIL/uL (ref 3.87–5.11)
RDW: 16 % — ABNORMAL HIGH (ref 11.5–15.5)
WBC: 10.3 10*3/uL (ref 4.0–10.5)

## 2015-06-24 MED ORDER — CEFTRIAXONE SODIUM 250 MG IJ SOLR
250.0000 mg | Freq: Once | INTRAMUSCULAR | Status: AC
  Administered 2015-06-24: 250 mg via INTRAMUSCULAR
  Filled 2015-06-24: qty 250

## 2015-06-24 MED ORDER — TRAMADOL HCL 50 MG PO TABS: 50.0000 mg | ORAL_TABLET | Freq: Four times a day (QID) | ORAL | Status: DC | PRN

## 2015-06-24 MED ORDER — AZITHROMYCIN 500 MG PO TABS
1000.0000 mg | ORAL_TABLET | Freq: Once | ORAL | Status: AC
  Administered 2015-06-24: 1000 mg via ORAL
  Filled 2015-06-24: qty 2

## 2015-06-24 MED ORDER — DOXYCYCLINE HYCLATE 100 MG PO CAPS
100.0000 mg | ORAL_CAPSULE | Freq: Two times a day (BID) | ORAL | Status: DC
Start: 2015-06-24 — End: 2018-01-29

## 2015-06-24 MED ORDER — IBUPROFEN 600 MG PO TABS: 600.0000 mg | ORAL_TABLET | Freq: Four times a day (QID) | ORAL | Status: DC | PRN

## 2015-06-24 MED ORDER — METRONIDAZOLE 500 MG PO TABS: 500.0000 mg | ORAL_TABLET | Freq: Two times a day (BID) | ORAL | Status: DC

## 2015-06-24 NOTE — Discharge Summary (Signed)
Physician Discharge Summary  Patient ID: Alisha Johnson MRN: [id number] DOB/AGE: April 09, 1991 25 y.o.  Admit date: 06/22/2015 Discharge date: 06/24/2015  Admission Problems:   Left tubo-ovarian abscess Discharge Problems:   Left tubo-ovarian abscess   Gonorrhea   Chlamydia  Discharged Condition: stable  Hospital Course: 25 y.o. G1P0 female who was admitted for abdominal pain after CT showed dilated left fallopian tube and TOA on left. She was started on Cefoxitin, Doxycycline and Metronidazole and her pain improved.  She remained afebrile throughout her admission.  Her labs did show positive gonorrhea and chlamydia cultures, patient was informed and she was treated with Ceftriaxone 250 mg IM x 1 and Azithromycin 1000 mg po x 1.  HIV and RPR were negative; patient was advised to let her sexual partner(s) know so they can get tested and get appropriate treatment.  It was also advised that the patient and sexual partner(s) should abstain from unprotected sexual activity for seven days after everyone receives appropriate treatment.  After two days of IV antibiotics, she was transitioned to oral Doxycycline and Metronidazole for 14 days for the TOA.   She was eating regular diet, ambulating and her pain was controlled on oral analgesia. She was deemed stable for discharge to home.      Consults: None  Significant Diagnostic Studies: Results for orders placed or performed during the hospital encounter of 06/22/15 (from the past 168 hour(s))  GC/Chlamydia probe amp   Collection Time: 06/22/15 12:00 AM  Result Value Ref Range   Chlamydia **POSITIVE** (A)    Neisseria gonorrhea **POSITIVE** (A)   Urinalysis, Routine w reflex microscopic (not at [facility])   Collection Time: 06/22/15  5:20 PM  Result Value Ref Range   Color, Urine YELLOW YELLOW   APPearance CLOUDY (A) CLEAR   Specific Gravity, Urine 1.012 1.005 - 1.030   pH 7.0 5.0 - 8.0   Glucose, UA NEGATIVE NEGATIVE mg/dL   Hgb urine dipstick  TRACE (A) NEGATIVE   Bilirubin Urine NEGATIVE NEGATIVE   Ketones, ur NEGATIVE NEGATIVE mg/dL   Protein, ur NEGATIVE NEGATIVE mg/dL   Nitrite NEGATIVE NEGATIVE   Leukocytes, UA TRACE (A) NEGATIVE  Pregnancy, urine   Collection Time: 06/22/15  5:20 PM  Result Value Ref Range   Preg Test, Ur NEGATIVE NEGATIVE  Urine microscopic-add on   Collection Time: 06/22/15  5:20 PM  Result Value Ref Range   Squamous Epithelial / LPF 0-5 (A) NONE SEEN   WBC, UA 0-5 0 - 5 WBC/hpf   RBC / HPF 0-5 0 - 5 RBC/hpf   Bacteria, UA RARE (A) NONE SEEN  Comprehensive metabolic panel   Collection Time: 06/22/15  5:31 PM  Result Value Ref Range   Sodium 136 135 - 145 mmol/L   Potassium 3.7 3.5 - 5.1 mmol/L   Chloride 104 101 - 111 mmol/L   CO2 22 22 - 32 mmol/L   Glucose, Bld 90 65 - 99 mg/dL   BUN 5 (L) 6 - 20 mg/dL   Creatinine, Ser 7.82 0.44 - 1.00 mg/dL   Calcium 8.8 (L) 8.9 - 10.3 mg/dL   Total Protein 7.4 6.5 - 8.1 g/dL   Albumin 3.9 3.5 - 5.0 g/dL   AST 15 15 - 41 U/L   ALT 11 (L) 14 - 54 U/L   Alkaline Phosphatase 86 38 - 126 U/L   Total Bilirubin 0.9 0.3 - 1.2 mg/dL   GFR calc non Af Amer >60 >60 mL/min   GFR calc Af Amer >60 >  60 mL/min   Anion gap 10 5 - 15  Lipase, blood   Collection Time: 06/22/15  5:31 PM  Result Value Ref Range   Lipase 17 11 - 51 U/L  CBC WITH DIFFERENTIAL   Collection Time: 06/22/15  5:31 PM  Result Value Ref Range   WBC 19.6 (H) 4.0 - 10.5 K/uL   RBC 4.56 3.87 - 5.11 MIL/uL   Hemoglobin 12.0 12.0 - 15.0 g/dL   HCT 40.9 81.1 - 91.4 %   MCV 81.1 78.0 - 100.0 fL   MCH 26.3 26.0 - 34.0 pg   MCHC 32.4 30.0 - 36.0 g/dL   RDW 78.2 95.6 - 21.3 %   Platelets 403 (H) 150 - 400 K/uL   Neutrophils Relative % 80 %   Neutro Abs 15.8 (H) 1.7 - 7.7 K/uL   Lymphocytes Relative 16 %   Lymphs Abs 3.0 0.7 - 4.0 K/uL   Monocytes Relative 4 %   Monocytes Absolute 0.8 0.1 - 1.0 K/uL   Eosinophils Relative 0 %   Eosinophils Absolute 0.0 0.0 - 0.7 K/uL   Basophils Relative 0  %   Basophils Absolute 0.0 0.0 - 0.1 K/uL  RPR   Collection Time: 06/22/15  5:31 PM  Result Value Ref Range   RPR Ser Ql Non Reactive Non Reactive  HIV antibody   Collection Time: 06/22/15  5:31 PM  Result Value Ref Range   HIV Screen 4th Generation wRfx Non Reactive Non Reactive  Wet prep, genital   Collection Time: 06/22/15  5:50 PM  Result Value Ref Range   Yeast Wet Prep HPF POC NONE SEEN NONE SEEN   Trich, Wet Prep NONE SEEN NONE SEEN   Clue Cells Wet Prep HPF POC PRESENT (A) NONE SEEN   WBC, Wet Prep HPF POC MANY (A) NONE SEEN   Sperm NONE SEEN   I-Stat CG4 Lactic Acid, ED   Collection Time: 06/22/15  9:45 PM  Result Value Ref Range   Lactic Acid, Venous 0.44 (L) 0.5 - 2.0 mmol/L  CBC with Differential/Platelet   Collection Time: 06/24/15 10:25 AM  Result Value Ref Range   WBC 10.3 4.0 - 10.5 K/uL   RBC 4.27 3.87 - 5.11 MIL/uL   Hemoglobin 11.5 (L) 12.0 - 15.0 g/dL   HCT 08.6 (L) 57.8 - 46.9 %   MCV 82.2 78.0 - 100.0 fL   MCH 26.9 26.0 - 34.0 pg   MCHC 32.8 30.0 - 36.0 g/dL   RDW 62.9 (H) 52.8 - 41.3 %   Platelets 406 (H) 150 - 400 K/uL   Neutrophils Relative % 75 %   Neutro Abs 7.7 1.7 - 7.7 K/uL   Lymphocytes Relative 22 %   Lymphs Abs 2.3 0.7 - 4.0 K/uL   Monocytes Relative 2 %   Monocytes Absolute 0.2 0.1 - 1.0 K/uL   Eosinophils Relative 1 %   Eosinophils Absolute 0.1 0.0 - 0.7 K/uL   Basophils Relative 0 %   Basophils Absolute 0.0 0.0 - 0.1 K/uL    US Transvaginal Non-ob  06/24/2015  CLINICAL DATA:  Pelvic pain x2 days, PID, TOA on CT EXAM: TRANSABDOMINAL AND TRANSVAGINAL ULTRASOUND OF PELVIS TECHNIQUE: Both transabdominal and transvaginal ultrasound examinations of the pelvis were performed. Transabdominal technique was performed for global imaging of the pelvis including uterus, ovaries, adnexal regions, and pelvic cul-de-sac. It was necessary to proceed with endovaginal exam following the transabdominal exam to visualize the left ovary. COMPARISON:  CT  abdomen pelvis dated 06/22/2015  FINDINGS: Uterus Measurements: 10.2 x 4.6 x 5.3 cm. No fibroids or other mass visualized. Endometrium Thickness: 3 mm.  No focal abnormality visualized. Right ovary Measurements: 3.7 x 3.4 x 1.9 cm. Normal appearance/no adnexal mass. Left ovary Measurements: 3.1 x 2.4 x 1.9 cm. Adjacent complex fluid/ soft tissue in the left adnexa, difficult to separate from the left ovary, measuring approximately 4.3 x 2.5 x 2.2 cm. Other findings No abnormal free fluid. IMPRESSION: Complex fluid/soft tissue in the left adnexa, measuring approximately 4.3 x 2.5 x 2.2 cm but difficult to separate from normal ovary, worrisome for tubo-ovarian abscess in the setting of PID. Electronically Signed   By: Charline Bills M.D.   On: 06/24/2015 10:07   US Pelvis Complete  06/24/2015  CLINICAL DATA:  Pelvic pain x2 days, PID, TOA on CT EXAM: TRANSABDOMINAL AND TRANSVAGINAL ULTRASOUND OF PELVIS TECHNIQUE: Both transabdominal and transvaginal ultrasound examinations of the pelvis were performed. Transabdominal technique was performed for global imaging of the pelvis including uterus, ovaries, adnexal regions, and pelvic cul-de-sac. It was necessary to proceed with endovaginal exam following the transabdominal exam to visualize the left ovary. COMPARISON:  CT abdomen pelvis dated 06/22/2015 FINDINGS: Uterus Measurements: 10.2 x 4.6 x 5.3 cm. No fibroids or other mass visualized. Endometrium Thickness: 3 mm.  No focal abnormality visualized. Right ovary Measurements: 3.7 x 3.4 x 1.9 cm. Normal appearance/no adnexal mass. Left ovary Measurements: 3.1 x 2.4 x 1.9 cm. Adjacent complex fluid/ soft tissue in the left adnexa, difficult to separate from the left ovary, measuring approximately 4.3 x 2.5 x 2.2 cm. Other findings No abnormal free fluid. IMPRESSION: Complex fluid/soft tissue in the left adnexa, measuring approximately 4.3 x 2.5 x 2.2 cm but difficult to separate from normal ovary, worrisome for  tubo-ovarian abscess in the setting of PID. Electronically Signed   By: Charline Bills M.D.   On: 06/24/2015 10:07   Ct Abdomen Pelvis W Contrast  06/22/2015  CLINICAL DATA:  Lower abdominal pain and diarrhea for 1 month. Subsequent encounter. EXAM: CT ABDOMEN AND PELVIS WITH CONTRAST TECHNIQUE: Multidetector CT imaging of the abdomen and pelvis was performed using the standard protocol following bolus administration of intravenous contrast. CONTRAST:  25 mL OMNIPAQUE IOHEXOL 300 MG/ML SOLN, 100 mL OMNIPAQUE IOHEXOL 300 MG/ML SOLN COMPARISON:  CT abdomen and pelvis 05/30/2015. FINDINGS: Lung bases are clear.  No pleural or pericardial effusion. The gallbladder, liver, spleen, adrenal glands, pancreas, biliary tree and kidneys all appear normal. The left fallopian tube is mildly dilated with fluid within it. A multicystic structure with hyperenhancement is seen in the left ovary. The uterus and right ovary are unremarkable. Urinary bladder appears normal. There is a small volume of free pelvic fluid which is greater than typically seen in physiologic change. The stomach, small bowel and appendix appear normal. There is mild stranding in omental fat. No lymphadenopathy is seen. No lytic or sclerotic bony lesion is identified. Fixation hardware in the proximal left femur is noted. IMPRESSION: Findings most suspicious for tubo-ovarian abscess on the left. No other acute abnormality is identified. Electronically Signed   By: Drusilla Kanner M.D.   On: 06/22/2015 21:12    Discharge Exam: Blood pressure 120/71, pulse 66, temperature 98.3 F (36.8 C), temperature source Oral, resp. rate 18, height  (1.575 m), weight 133 lb 12 oz (60.669 kg), last menstrual period 05/31/2015, SpO2 98 %, unknown if currently breastfeeding. General appearance: alert, cooperative and no distress Resp: clear to auscultation bilaterally Cardio: regular rate  and rhythm GI: soft, non-tender; bowel sounds normal; no masses,  no  organomegaly Pelvic: deferred Extremities: extremities normal, atraumatic, no cyanosis or edema and Homans sign is negative, no sign of DVT Skin: Skin color, texture, turgor normal. No rashes or lesions Neurologic: Alert and oriented X 3, normal strength and tone. Normal symmetric reflexes. Normal coordination and gait  Disposition: 01-Home or Self Care     Medication List    TAKE these medications        doxycycline 100 MG capsule  Commonly known as:  VIBRAMYCIN  Take 1 capsule (100 mg total) by mouth 2 (two) times daily.     ibuprofen 600 MG tablet  Commonly known as:  ADVIL,MOTRIN  Take 1 tablet (600 mg total) by mouth every 6 (six) hours as needed (mild pain).     metFORMIN 500 MG tablet  Commonly known as:  GLUCOPHAGE  Take 500 mg by mouth 2 (two) times daily with a meal.     metroNIDAZOLE 500 MG tablet  Commonly known as:  FLAGYL  Take 1 tablet (500 mg total) by mouth 2 (two) times daily.     traMADol 50 MG tablet  Commonly known as:  ULTRAM  Take 1-2 tablets (50-100 mg total) by mouth every 6 (six) hours as needed for severe pain.       Follow-up Information    Follow up with [facility] On 07/19/2015.   Specialty:  Obstetrics and Gynecology   Why:  9:30 am with Dr. Erin Fulling for follow up appointment.  Call clinic or go to ED for any acute or concerning issues.   Contact information:   [address] [place] [phone]      Signed: Tereso Newcomer, MD 06/24/2015, 3:53 PM

## 2015-06-24 NOTE — Progress Notes (Addendum)
Teaching complete  meds given as ordered  Pt ambulated out  With staff   Pt to drive herself home

## 2015-06-24 NOTE — Discharge Instructions (Signed)
Pelvic Inflammatory Disease   Pelvic inflammatory disease (PID) refers to an infection in some or all of the female organs. The infection can be in the uterus, ovaries, fallopian tubes, or the surrounding tissues in the pelvis. PID can cause abdominal or pelvic pain that comes on suddenly (acute pelvic pain). PID is a serious infection because it can lead to lasting (chronic) pelvic pain or the inability to have children (infertility).  CAUSES  This condition is most often caused by an infection that is spread during sexual contact. However, the infection can also be caused by the normal bacteria that are found in the vaginal tissues if these bacteria travel upward into the reproductive organs. PID can also occur following:  The birth of a baby.  A miscarriage.  An abortion.  Major pelvic surgery.  The use of an intrauterine device (IUD).  A sexual assault. RISK FACTORS  This condition is more likely to develop in women who:  Are younger than 25 years of age.  Are sexually active at a young age.  Use nonbarrier contraception.  Have multiple sexual partners.  Have sex with someone who has symptoms of an STD (sexually transmitted disease).  Use oral contraception. At times, certain behaviors can also increase the possibility of getting PID, such as:  Using a vaginal douche.  Having an IUD in place. SYMPTOMS  Symptoms of this condition include:  Abdominal or pelvic pain.  Fever.  Chills.  Abnormal vaginal discharge.  Abnormal uterine bleeding.  Unusual pain shortly after the end of a menstrual period.  Painful urination.  Pain with sexual intercourse.  Nausea and vomiting. DIAGNOSIS  To diagnose this condition, your health care provider will do a physical exam and take your medical history. A pelvic exam typically reveals great tenderness in the uterus and the surrounding pelvic tissues. You may also have tests, such as:  Lab tests, including a pregnancy test, blood tests, and urine  test.  Culture tests of the vagina and cervix to check for an STD.  Ultrasound.  A laparoscopic procedure to look inside the pelvis.  Examining vaginal secretions under a microscope. TREATMENT  Treatment for this condition may involve one or more approaches.  Antibiotic medicines may be prescribed to be taken by mouth.  Sexual partners may need to be treated if the infection is caused by an STD.  For more severe cases, hospitalization may be needed to give antibiotics directly into a vein through an IV tube.  Surgery may be needed if other treatments do not help, but this is rare. It may take weeks until you are completely well. If you are diagnosed with PID, you should also be checked for human immunodeficiency virus (HIV). Your health care provider may test you for infection again 3 months after treatment. You should not have unprotected sex.  HOME CARE INSTRUCTIONS  Take over-the-counter and prescription medicines only as told by your health care provider.  If you were prescribed an antibiotic medicine, take it as told by your health care provider. Do not stop taking the antibiotic even if you start to feel better.  Do not have sexual intercourse until treatment is completed or as told by your health care provider. If PID is confirmed, your recent sexual partners will need treatment, especially if you had unprotected sex.  Keep all follow-up visits as told by your health care provider. This is important. SEEK MEDICAL CARE IF:  You have increased or abnormal vaginal discharge.  Your pain does not improve.  cannot tolerate your medicines. °· Your partner has an STD. °· You have pain when you urinate. °SEEK IMMEDIATE MEDICAL CARE IF: °· You have increased abdominal or pelvic pain. °· You have chills. °· Your symptoms are not better in 72 hours even with treatment. °  °This information is not intended to replace advice given to  you by your health care provider. Make sure you discuss any questions you have with your health care provider. °  °Document Released: 05/01/2005 Document Revised: 01/20/2015 Document Reviewed: 06/08/2014 °Elsevier Interactive Patient Education ©2016 [employer] ° °Pelvic Inflammatory Disease °Pelvic inflammatory disease (PID) refers to an infection in some or all of the female organs. The infection can be in the uterus, ovaries, fallopian tubes, or the surrounding tissues in the pelvis. PID can cause abdominal or pelvic pain that comes on suddenly (acute pelvic pain). PID is a serious infection because it can lead to lasting (chronic) pelvic pain or the inability to have children (infertility). °CAUSES °This condition is most often caused by an infection that is spread during sexual contact. However, the infection can also be caused by the normal bacteria that are found in the vaginal tissues if these bacteria travel upward into the reproductive organs. PID can also occur following: °· The birth of a baby. °· A miscarriage. °· An abortion. °· Major pelvic surgery. °· The use of an intrauterine device (IUD). °· A sexual assault. °RISK FACTORS °This condition is more likely to develop in women who: °· Are younger than 25 years of age. °· Are sexually active at a young age. °· Use nonbarrier contraception. °· Have multiple sexual partners. °· Have sex with someone who has symptoms of an STD (sexually transmitted disease). °· Use oral contraception. °At times, certain behaviors can also increase the possibility of getting PID, such as: °· Using a vaginal douche. °· Having an IUD in place. °SYMPTOMS °Symptoms of this condition include: °· Abdominal or pelvic pain. °· Fever. °· Chills. °· Abnormal vaginal discharge. °· Abnormal uterine bleeding. °· Unusual pain shortly after the end of a menstrual period. °· Painful urination. °· Pain with sexual intercourse. °· Nausea and vomiting. °DIAGNOSIS °To diagnose this  condition, your health care provider will do a physical exam and take your medical history. A pelvic exam typically reveals great tenderness in the uterus and the surrounding pelvic tissues. You may also have tests, such as: °· Lab tests, including a pregnancy test, blood tests, and urine test. °· Culture tests of the vagina and cervix to check for an STD. °· Ultrasound. °· A laparoscopic procedure to look inside the pelvis. °· Examining vaginal secretions under a microscope. °TREATMENT °Treatment for this condition may involve one or more approaches. °· Antibiotic medicines may be prescribed to be taken by mouth. °· Sexual partners may need to be treated if the infection is caused by an STD. °· For more severe cases, hospitalization may be needed to give antibiotics directly into a vein through an IV tube. °· Surgery may be needed if other treatments do not help, but this is rare. °It may take weeks until you are completely well. If you are diagnosed with PID, you should also be checked for human immunodeficiency virus (HIV). Your health care provider may test you for infection again 3 months after treatment. You should not have unprotected sex. °HOME CARE INSTRUCTIONS °· Take over-the-counter and prescription medicines only as told by your health care provider. °· If you were prescribed an antibiotic medicine, take it as   were prescribed an antibiotic medicine, take it as told by your health care provider. Do not stop taking the antibiotic even if you start to feel better.  Do not have sexual intercourse until treatment is completed or as told by your health care provider. If PID is confirmed, your recent sexual partners will need treatment, especially if you had unprotected sex.  Keep all follow-up visits as told by your health care provider. This is important. SEEK MEDICAL CARE IF:  You have increased or abnormal vaginal discharge.  Your pain does not improve.  You vomit.  You have a fever.  You cannot tolerate your medicines.  Your partner has an STD.  You have pain when you urinate. SEEK  IMMEDIATE MEDICAL CARE IF:  You have increased abdominal or pelvic pain.  You have chills.  Your symptoms are not better in 72 hours even with treatment.   This information is not intended to replace advice given to you by your health care provider. Make sure you discuss any questions you have with your health care provider.   Document Released: 05/01/2005 Document Revised: 01/20/2015 Document Reviewed: 06/08/2014 Elsevier Interactive Patient Education [employer]

## 2015-07-04 ENCOUNTER — Emergency Department (HOSPITAL_BASED_OUTPATIENT_CLINIC_OR_DEPARTMENT_OTHER)
Admission: EM | Admit: 2015-07-04 | Discharge: 2015-07-04 | Disposition: A | Discharge status: Home / Self Care | Payer: Medicaid Other | Attending: Emergency Medicine | Admitting: Emergency Medicine

## 2015-07-04 ENCOUNTER — Encounter (HOSPITAL_BASED_OUTPATIENT_CLINIC_OR_DEPARTMENT_OTHER): Payer: Self-pay

## 2015-07-04 DIAGNOSIS — E119 Type 2 diabetes mellitus without complications: Secondary | ICD-10-CM | POA: Diagnosis not present

## 2015-07-04 DIAGNOSIS — I808 Phlebitis and thrombophlebitis of other sites: Secondary | ICD-10-CM | POA: Diagnosis not present

## 2015-07-04 DIAGNOSIS — I10 Essential (primary) hypertension: Secondary | ICD-10-CM | POA: Diagnosis not present

## 2015-07-04 DIAGNOSIS — M791 Myalgia: Secondary | ICD-10-CM | POA: Diagnosis not present

## 2015-07-04 DIAGNOSIS — F17210 Nicotine dependence, cigarettes, uncomplicated: Secondary | ICD-10-CM | POA: Insufficient documentation

## 2015-07-04 DIAGNOSIS — M7989 Other specified soft tissue disorders: Secondary | ICD-10-CM | POA: Diagnosis present

## 2015-07-04 DIAGNOSIS — Z792 Long term (current) use of antibiotics: Secondary | ICD-10-CM | POA: Insufficient documentation

## 2015-07-04 DIAGNOSIS — Z7984 Long term (current) use of oral hypoglycemic drugs: Secondary | ICD-10-CM | POA: Diagnosis not present

## 2015-07-04 MED ORDER — ASPIRIN EC 325 MG PO TBEC: 650.0000 mg | DELAYED_RELEASE_TABLET | Freq: Every day | ORAL | Status: AC

## 2015-07-04 NOTE — ED Provider Notes (Signed)
Medical screening examination/treatment/procedure(s) were conducted as a shared visit with non-physician practitioner(s) and myself.  I personally evaluated the patient during the encounter.   EKG Interpretation None     25 y.o. female presents with swelling in hand and wrist after IV recently. C/w thrombophlebitis but do not suspect infection currently. High dose ASA and warm compresses. Plan to follow up with PCP as needed and return precautions discussed for worsening or new concerning symptoms.   See related encounter note  Emergency Focused Ultrasound Exam Limited Ultrasound of Soft Tissue   Performed and interpreted by Dr. Clydene Pugh Indication: evaluation for infection or foreign body Transverse and Sagittal views of left hand and wrist are obtained in real time for the purposes of evaluation of skin and underlying soft tissues.  Findings: no heterogeneous fluid collection, no hyperemia/edema of surrounding tissue, incompressible dorsal vascular structure Interpretation: no abscess, no cellulitis, + superficial thrombophlebitis Images archived electronically.  CPT Codes:  Upper extremity [id number]  Other soft tissue 16109-60    Lyndal Pulley, MD 07/04/15 [phone]

## 2015-07-04 NOTE — ED Notes (Signed)
Patient here with ongoing left hand swelling after IV in same 2 weeks ago.  Patient denies any new injury

## 2015-07-04 NOTE — ED Provider Notes (Signed)
CSN: [id number]     Arrival date & time 07/04/15  1026 History   First MD Initiated Contact with Patient 07/04/15 1049     Chief Complaint  Patient presents with  . hand swelling    (Consider location/radiation/quality/duration/timing/severity/associated sxs/prior Treatment) The history is provided by the patient. No language interpreter was used.    Alisha Johnson is a 25 year old female with a history of hypertension, diabetes, and hypothyroidism who presents for left hand swelling after having IV placed in the hand for antibiotics 2 weeks ago. She reports having a cyst on her ovary and was transferred to [facility]. She states there was pain in the hand during her stay but that she was told that that was normal and most likely the antibiotics going in. When she went home she did not have swelling or redness but this developed over the last 3 days. It is progressively worsened and is painful to touch. She denies any treatment for the pain. She denies any fever, chills.   Past Medical History  Diagnosis Date  . Hypertension   . Hypothyroid   . Diabetes mellitus without complication [facility])    Past Surgical History  Procedure Laterality Date  . Femur surgery    . Hip fracture surgery     No family history on file. Social History  Substance Use Topics  . Smoking status: Current Every Day Smoker -- 1.50 packs/day for 3 years    Types: Cigarettes  . Smokeless tobacco: Never Used  . Alcohol Use: Yes     Comment: daily   OB History    Gravida Para Term Preterm AB TAB SAB Ectopic Multiple Living   1              Review of Systems  Constitutional: Negative for fever and chills.  Musculoskeletal: Positive for myalgias.  Skin: Positive for color change.  Neurological: Negative for numbness.  All other systems reviewed and are negative.     Allergies  Iohexol  Home Medications   Prior to Admission medications   Medication Sig Start Date End Date Taking? Authorizing Provider   aspirin EC 325 MG tablet Take 2 tablets (650 mg total) by mouth daily. 07/04/15 07/10/15  Catha Gosselin, PA-C  doxycycline (VIBRAMYCIN) 100 MG capsule Take 1 capsule (100 mg total) by mouth 2 (two) times daily. 06/24/15   Tereso Newcomer, MD  metFORMIN (GLUCOPHAGE) 500 MG tablet Take 500 mg by mouth 2 (two) times daily with a meal.    Historical Provider, MD   BP 119/84 mmHg  Pulse 77  Temp(Src) 98.5 F (36.9 C) (Oral)  Resp 18  SpO2 100%  LMP 05/31/2015 Physical Exam  Constitutional: She is oriented to person, place, and time. She appears well-developed and well-nourished.  HENT:  Head: Normocephalic and atraumatic.  Eyes: Conjunctivae are normal.  Neck: Normal range of motion. Neck supple.  Cardiovascular: Normal rate.   Pulmonary/Chest: Effort normal. No respiratory distress.  Musculoskeletal: Normal range of motion.  Left hand tenderness, edema, erythema, and warmth along the dorsum of the hand and wrist. No streaking. Able to flex and extend all fingers. 2+ radial pulse. Less than 2 second capillary refill. No pallor.  Neurological: She is alert and oriented to person, place, and time.  Skin: Skin is warm and dry.  Psychiatric: She has a normal mood and affect.  Nursing note and vitals reviewed.   ED Course  Procedures (including critical care time)  Labs Review Labs Reviewed - No data to  display  Imaging Review No results found. I have personally reviewed and evaluated these image results as part of my medical decision-making.   EKG Interpretation None      MDM   Final diagnoses:  Thrombophlebitis of left arm   Patient presents for left hand swelling, erythema, and tenderness 3 days. Recently had IV placed 2 weeks ago for antibiotics related to an ovarian cyst. Possibly cellulitis vs. DVT. She is afebrile and otherwise well appearing. No acute distress.  I discussed this patient with Dr. Clydene Pugh who has seen and evaluated the patient.  He did a bedside US.   She has thrombophlebitis. See his note for [place]. Return precautions were discussed such as fever, chills, increased swelling or redness. She was put on  aspirin daily x 7 days. I discussed recheck in 2 days. Patient agrees with plan.  Filed Vitals:   07/04/15 1045  BP: 119/84  Pulse: 77  Temp: 98.5 F (36.9 C)  Resp: [address], PA-C 07/04/15 1549  Lyndal Pulley, MD 07/04/15 1555

## 2015-07-04 NOTE — Discharge Instructions (Signed)
Phlebitis Recheck within 48 hours. Take  aspirin daily for 7 days. Return for fever, chills, increased swelling or redness.  Phlebitis is soreness and puffiness (swelling) in a vein.  HOME CARE  Only take medicine as told by your doctor.  Raise (elevate) the affected limb on a pillow as told by your doctor.  Keep a warm pack on the affected vein as told by your doctor. Do not sleep with a heating pad.  Use special stockings or bandages around the area of the affected vein as told by your doctor. These will speed healing and keep the condition from coming back.  Talk to your doctor about all the medicines you take.  Get follow-up blood tests as told by your doctor.  If the phlebitis is in your legs:  Avoid standing or resting for long periods.  Keep your legs moving. Raise your legs when you sit or lie.  Do not smoke.  Follow-up with your doctor as told. GET HELP IF:  You have strange bruises or bleeding.  Your puffiness or pain in the affected area is not getting better.  You are taking medicine to lessen puffiness (anti-inflammatory medicine), and you get belly pain.  You have a fever. GET HELP RIGHT AWAY IF:   The phlebitis gets worse and you have more pain, puffiness (swelling), or redness.  You have trouble breathing or have chest pain. MAKE SURE YOU:   Understand these instructions.  Will watch your condition.  Will get help right away if you are not doing well or get worse.   This information is not intended to replace advice given to you by your health care provider. Make sure you discuss any questions you have with your health care provider.   Document Released: 04/19/2009 Document Revised: 05/06/2013 Document Reviewed: 01/06/2013 Elsevier Interactive Patient Education [employer]

## 2015-07-19 ENCOUNTER — Encounter: Payer: Medicaid Other | Admitting: Obstetrics & Gynecology

## 2015-07-22 ENCOUNTER — Encounter: Payer: Medicaid Other | Admitting: Obstetrics & Gynecology

## 2016-08-20 ENCOUNTER — Emergency Department (HOSPITAL_BASED_OUTPATIENT_CLINIC_OR_DEPARTMENT_OTHER)
Admission: EM | Admit: 2016-08-20 | Discharge: 2016-08-20 | Disposition: A | Discharge status: Home / Self Care | Payer: Medicaid Other | Attending: Emergency Medicine | Admitting: Emergency Medicine

## 2016-08-20 ENCOUNTER — Encounter (HOSPITAL_BASED_OUTPATIENT_CLINIC_OR_DEPARTMENT_OTHER): Payer: Self-pay | Admitting: *Deleted

## 2016-08-20 ENCOUNTER — Emergency Department (HOSPITAL_BASED_OUTPATIENT_CLINIC_OR_DEPARTMENT_OTHER): Payer: Medicaid Other

## 2016-08-20 DIAGNOSIS — E039 Hypothyroidism, unspecified: Secondary | ICD-10-CM | POA: Diagnosis not present

## 2016-08-20 DIAGNOSIS — I10 Essential (primary) hypertension: Secondary | ICD-10-CM | POA: Diagnosis not present

## 2016-08-20 DIAGNOSIS — E119 Type 2 diabetes mellitus without complications: Secondary | ICD-10-CM | POA: Insufficient documentation

## 2016-08-20 DIAGNOSIS — M25562 Pain in left knee: Secondary | ICD-10-CM | POA: Diagnosis not present

## 2016-08-20 DIAGNOSIS — F17210 Nicotine dependence, cigarettes, uncomplicated: Secondary | ICD-10-CM | POA: Diagnosis not present

## 2016-08-20 MED ORDER — IBUPROFEN 400 MG PO TABS
600.0000 mg | ORAL_TABLET | Freq: Once | ORAL | Status: AC
  Administered 2016-08-20: 600 mg via ORAL
  Filled 2016-08-20: qty 1
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
## 2016-08-20 NOTE — ED Triage Notes (Signed)
Patient states she has a old hip and femur fracture with rods and screws fixation from 4 years ago.  Woke up this morning with pain and stiffness in the left knee.  No known injury.

## 2016-08-20 NOTE — Discharge Instructions (Signed)
Please use naproxen or ibuprofen as needed for pain. Please use cold compress to the area. Please follow-up with orthopedic surgery, Dr. Pearletha Forge, if symptoms have not improved in 1-2 weeks. Make sure to rest, ice, compress, elevate your left knee.  Contact a health care provider if: Your knee pain continues, changes, or gets worse. You have a fever along with knee pain. Your knee buckles or locks up. Your knee swells, and the swelling becomes worse. Get help right away if: Your knee feels warm to the touch. You cannot move your knee. You have severe pain in your knee. You have chest pain. You have trouble breathing.

## 2016-08-20 NOTE — ED Provider Notes (Signed)
[facility] Provider Note   CSN: [id number] Arrival date & time: 08/20/16  1854 By signing my name below, I, Bridgette Habermann, attest that this documentation has been prepared under the direction and in the presence of Janeann Paisley, [place]. Electronically Signed: Bridgette Habermann, ED Scribe. 08/20/16. 8:39 PM.  History   Chief Complaint Chief Complaint  Patient presents with  . Knee Pain    left    HPI The history is provided by the patient. No language interpreter was used.   HPI Comments: Alisha Johnson is a 26 y.o. female with h/o DM and HTN, who presents to the Emergency Department complaining of left knee pain and stiffness beginning this morning. Pain is dull in quality, she rates it an 8/10. Pt reports she woke up with this pain, denies any recent injury or trauma. Her pain is exacerbated with ambulating, bearing weight, and bending of the knee. Pt has taken Advil and Tylenol with no relief. Pt has h/o left femur surgery to her left knee 4 years ago with rods and screw fixation placed. Pt denies fever, chills, numbness, or any other associated symptoms.  Past Medical History:  Diagnosis Date  . Diabetes mellitus without complication (HCC)   . Hypertension   . Hypothyroid     Patient Active Problem List   Diagnosis Date Noted  . Gonorrhea 06/24/2015  . Chlamydia 06/24/2015  . Left tubo-ovarian abscess   . Tubo-ovarian abscess 06/23/2015  . Essential hypertension, benign 06/23/2015  . Diabetes (HCC) 06/23/2015    Past Surgical History:  Procedure Laterality Date  . FEMUR SURGERY    . HIP FRACTURE SURGERY      OB History    Gravida Para Term Preterm AB Living   1             SAB TAB Ectopic Multiple Live Births                   Home Medications    Prior to Admission medications   Medication Sig Start Date End Date Taking? Authorizing Provider  CEPHALEXIN PO Take by mouth.   Yes Historical Provider, MD  doxycycline (VIBRAMYCIN) 100 MG capsule Take 1  capsule (100 mg total) by mouth 2 (two) times daily. 06/24/15  Yes Tereso Newcomer, MD  metFORMIN (GLUCOPHAGE) 500 MG tablet Take 500 mg by mouth 2 (two) times daily with a meal.   Yes Historical Provider, MD  metroNIDAZOLE (FLAGYL) 500 MG tablet Take 500 mg by mouth 3 (three) times daily.   Yes Historical Provider, MD    Family History No family history on file.  Social History Social History  Substance Use Topics  . Smoking status: Current Every Day Smoker    Packs/day: 1.50    Years: 3.00    Types: Cigarettes  . Smokeless tobacco: Never Used  . Alcohol use No     Allergies   Iohexol   Review of Systems Review of Systems  Constitutional: Negative for chills and fever.  Musculoskeletal: Positive for arthralgias. Negative for joint swelling.  All other systems reviewed and are negative.    Physical Exam Updated Vital Signs BP 134/79 (BP Location: Left Arm)   Pulse 80   Temp 100 F (37.8 C) (Oral)   Resp 18   Ht  (1.6 m)   Wt 61.7 kg   LMP  (Exact Date)   SpO2 100%   BMI 24.09 kg/m   Physical Exam  Constitutional: She appears well-developed and well-nourished.  Well appearing  HENT:  Head: Normocephalic and atraumatic.  Nose: Nose normal.  Eyes: Conjunctivae and EOM are normal.  Neck: Normal range of motion.  Cardiovascular: Normal rate and intact distal pulses.   2+ distal pulses BLEs.  Pulmonary/Chest: Effort normal. No respiratory distress.  Normal work of breathing. No respiratory distress noted.   Abdominal: Soft.  Musculoskeletal: Normal range of motion. She exhibits no edema, tenderness or deformity.  Full ROM on active and passive ROM bilaterally. Mild pain on flexion and extension to left knee. Positive McMurray's sign to left knee. No pain with flexion or extension on active or passive ROM to right knee. No TTP of knees or ankles. No crepitus. Negative patellar ballottement test. No effusion noted bilaterally. Negative anterior/poster drawer  bilaterally. Negative Lachman's test bilaterally. No varus or valgus laxity bilaterally.   Neurological: She is alert.  Sensation intact to BLEs. Muscle strength good against knee flexion and extension bilaterally. Patient able to stand and ambulate and tolerate weightbearing.   Skin: Skin is warm.  No redness or swelling to left knee.  Psychiatric: She has a normal mood and affect. Her behavior is normal.  Nursing note and vitals reviewed.    ED Treatments / Results  DIAGNOSTIC STUDIES: Oxygen Saturation is 100% on RA, normal by my interpretation.   COORDINATION OF CARE: 8:39 PM-Discussed next steps with pt. Pt verbalized understanding and is agreeable with the plan.   Labs (all labs ordered are listed, but only abnormal results are displayed) Labs Reviewed - No data to display  EKG  EKG Interpretation None       Radiology Dg Knee Complete 4 Views Left  Result Date: 08/20/2016 CLINICAL DATA:  Pain and stiffness in the left knee. No recent injury reported. EXAM: LEFT KNEE - COMPLETE 4+ VIEW COMPARISON:  None. FINDINGS: Intramedullary rod with distal interlocking screws partially visualized in the left distal femur, with no evidence of hardware fracture or loosening. No osseous fracture, joint effusion or malalignment in the left knee. No suspicious focal osseous lesion. No appreciable arthropathy. IMPRESSION: No fracture, joint effusion or malalignment in the left knee. Electronically Signed   By: Delbert Phenix M.D.   On: 08/20/2016 19:36    Procedures Procedures (including critical care time)  Medications Ordered in ED Medications  ibuprofen (ADVIL,MOTRIN) tablet 600 mg (600 mg Oral Given 08/20/16 2030)     Initial Impression / Assessment and Plan / ED Course  I have reviewed the triage vital signs and the nursing notes.  Pertinent labs & imaging results that were available during my care of the patient were reviewed by me and considered in my medical decision making (see  chart for details).    Patient X-Ray negative for obvious fracture or dislocation. Pain managed in ED. Does not appear to be infectious. Pt advised to follow up with orthopedics if symptoms persist for possibility of missed fracture diagnosis. Patient given ace wrap while in ED, conservative therapy recommended and discussed. Patient in NAD, hemodynamically stable, afebrile. Patient will be dc home & is agreeable with above plan. Return precautions given.   Final Clinical Impressions(s) / ED Diagnoses   Final diagnoses:  Acute pain of left knee    New Prescriptions New Prescriptions   No medications on file   I personally performed the services described in this documentation, which was scribed in my presence. The recorded information has been reviewed and is accurate.   [address] [place], [place] 08/20/16 2104    Lucita Lora  [place], [place] 08/20/16 2113    Rolan Bucco, MD 08/20/16 2226

## 2016-09-23 ENCOUNTER — Encounter (HOSPITAL_BASED_OUTPATIENT_CLINIC_OR_DEPARTMENT_OTHER): Payer: Self-pay | Admitting: *Deleted

## 2016-09-23 ENCOUNTER — Emergency Department (HOSPITAL_BASED_OUTPATIENT_CLINIC_OR_DEPARTMENT_OTHER)
Admission: EM | Admit: 2016-09-23 | Discharge: 2016-09-23 | Disposition: A | Discharge status: Home / Self Care | Payer: Medicaid Other | Attending: Emergency Medicine | Admitting: Emergency Medicine

## 2016-09-23 DIAGNOSIS — F17210 Nicotine dependence, cigarettes, uncomplicated: Secondary | ICD-10-CM | POA: Insufficient documentation

## 2016-09-23 DIAGNOSIS — E049 Nontoxic goiter, unspecified: Secondary | ICD-10-CM | POA: Insufficient documentation

## 2016-09-23 DIAGNOSIS — E119 Type 2 diabetes mellitus without complications: Secondary | ICD-10-CM | POA: Diagnosis not present

## 2016-09-23 DIAGNOSIS — E039 Hypothyroidism, unspecified: Secondary | ICD-10-CM | POA: Diagnosis not present

## 2016-09-23 DIAGNOSIS — Z7984 Long term (current) use of oral hypoglycemic drugs: Secondary | ICD-10-CM | POA: Insufficient documentation

## 2016-09-23 DIAGNOSIS — I10 Essential (primary) hypertension: Secondary | ICD-10-CM | POA: Insufficient documentation

## 2016-09-23 DIAGNOSIS — R220 Localized swelling, mass and lump, head: Secondary | ICD-10-CM | POA: Diagnosis present

## 2016-09-23 DIAGNOSIS — L989 Disorder of the skin and subcutaneous tissue, unspecified: Secondary | ICD-10-CM

## 2016-09-23 LAB — RAPID STREP SCREEN (MED CTR MEBANE ONLY): Streptococcus, Group A Screen (Direct): NEGATIVE

## 2016-09-23 MED ORDER — CLINDAMYCIN HCL 300 MG PO CAPS: 300.0000 mg | ORAL_CAPSULE | Freq: Three times a day (TID) | ORAL | 0 refills | Status: DC

## 2016-09-23 NOTE — ED Provider Notes (Signed)
[facility] Provider Note   CSN: [id number] Arrival date & time: 09/23/16  0536     History   Chief Complaint Chief Complaint  Patient presents with  . Facial Swelling    HPI Alisha Johnson is a 26 y.o. female.  The history is provided by the patient.  Illness  This is a chronic problem. The current episode started more than 1 week ago. The problem occurs constantly. The problem has been gradually worsening. Pertinent negatives include no chest pain, no abdominal pain, no headaches and no shortness of breath. Nothing aggravates the symptoms. Nothing relieves the symptoms. She has tried nothing for the symptoms. The treatment provided no relief.  Patient with a h/o hypothyroidism that she states resolved presents with gradual neck swelling.  States her thyroid has been enlarged since pregnancy but her labs were normal post pregnancy and now has a lump on the left lower neck.  States she saw her PMD at [facility] and they did not order any tests nor schedule and procedures.  No f/c/r.  No changes in speech.  No SOB.    Past Medical History:  Diagnosis Date  . Diabetes mellitus without complication (HCC)   . Hypertension   . Hypothyroid     Patient Active Problem List   Diagnosis Date Noted  . Gonorrhea 06/24/2015  . Chlamydia 06/24/2015  . Left tubo-ovarian abscess   . Tubo-ovarian abscess 06/23/2015  . Essential hypertension, benign 06/23/2015  . Diabetes (HCC) 06/23/2015    Past Surgical History:  Procedure Laterality Date  . FEMUR SURGERY    . HIP FRACTURE SURGERY      OB History    Gravida Para Term Preterm AB Living   1             SAB TAB Ectopic Multiple Live Births                   Home Medications    Prior to Admission medications   Medication Sig Start Date End Date Taking? Authorizing Provider  CEPHALEXIN PO Take by mouth.    [provider]  doxycycline (VIBRAMYCIN) 100 MG capsule Take 1 capsule (100 mg total) by  mouth 2 (two) times daily. 06/24/15   Anyanwu, Jethro Bastos, MD  metFORMIN (GLUCOPHAGE) 500 MG tablet Take 500 mg by mouth 2 (two) times daily with a meal.    [provider]  metroNIDAZOLE (FLAGYL) 500 MG tablet Take 500 mg by mouth 3 (three) times daily.    [provider]    Family History No family history on file.  Social History Social History  Substance Use Topics  . Smoking status: Current Every Day Smoker    Packs/day: 1.50    Years: 3.00    Types: Cigarettes  . Smokeless tobacco: Never Used  . Alcohol use No     Allergies   Iohexol   Review of Systems Review of Systems  Respiratory: Negative for shortness of breath.   Cardiovascular: Negative for chest pain.  Gastrointestinal: Negative for abdominal pain.  Neurological: Negative for headaches.     Physical Exam Updated Vital Signs BP 132/80 (BP Location: Left Arm)   Pulse 92   Temp 98.3 F (36.8 C) (Oral)   Resp 16   Ht 5\' 2"  (1.575 m)   Wt 133 lb (60.3 kg)   SpO2 100%   BMI 24.33 kg/m   Physical Exam  Constitutional: She is oriented to person, place, and time. She appears well-developed  and well-nourished. No distress.  HENT:  Head: Normocephalic and atraumatic.  Nose: Nose normal.  Mouth/Throat: Oropharynx is clear and moist. No oropharyngeal exudate.  No swelling of the lips, tongue or uvula mallempati class 1, intact phonation laying comfortably flat on the bed.    Eyes: Conjunctivae and EOM are normal. Pupils are equal, round, and reactive to light.  Neck: Normal range of motion and phonation normal. Neck supple. No spinous process tenderness and no muscular tenderness present. No neck rigidity. No tracheal deviation, no edema, no erythema and normal range of motion present. No Brudzinski's sign and no Kernig's sign noted. Thyromegaly present.    Cardiovascular: Normal rate, regular rhythm, normal heart sounds and intact distal pulses.   Pulmonary/Chest: Effort normal and breath  sounds normal. No stridor. She has no wheezes. She has no rales.  Abdominal: Soft. Bowel sounds are normal. She exhibits no mass. There is no tenderness. There is no rebound and no guarding.  Musculoskeletal: Normal range of motion. She exhibits no tenderness.  Lymphadenopathy:    She has no cervical adenopathy.  Neurological: She is alert and oriented to person, place, and time. She displays normal reflexes.  Skin: Skin is warm and dry. Capillary refill takes less than 2 seconds.  Psychiatric: She has a normal mood and affect.  Nursing note and vitals reviewed.    ED Treatments / Results   Vitals:   09/23/16 0541  BP: 132/80  Pulse: 92  Resp: 16  Temp: 98.3 F (36.8 C)    Labs (all labs ordered are listed, but only abnormal results are displayed) Results for orders placed or performed during the hospital encounter of 09/23/16  Rapid strep screen  Result Value Ref Range   Streptococcus, Group A Screen (Direct) NEGATIVE NEGATIVE   No results found.'  Radiology of thyroid for 2016  Ultrasound examination of the thyroid gland and adjacent soft tissues was performed. COMPARISON:11/27/2013  FINDINGS:  Right thyroid lobe Measurements: 6.0 x 1.3 x 2.5 cm. A few small hypoechoic nodules are noted. These measure 5 mm and less in size. Left thyroid lobe Measurements: 6.2 x 1.6 x 2.2 cm. A dominant cystic lesion is noted which measures 2.9 cm. It has some internal calcifications. Isthmus Thickness: 4 mm.No nodules visualized. Lymphadenopathy None visualized.  IMPRESSION: Enlargement of the cystic lesion in the lower pole of the left lobe of the thyroid when compared with the prior exam. No definitive solid component is identified to allow for biopsy although aspiration could be performed as clinically necessary. Continued followup is recommended. Electronically SignedBy: MarkLukens M.D.On: 03/22/2015 13:31   Procedures Procedures (including critical care  time)   Final Clinical Impressions(s) / ED Diagnoses   Final diagnoses:  Goiter  Lesion of neck  I suspect the lesion on the neck the is the cystic nodule seen on multiple thyroid ultrasounds in the past.  She has not been following up since 2016 for same. She has been advised that she will need thyroid blood and ultrasound testing by her PMD and follow up this week with endocrinology and a head and neck specialist regarding this lesion.  If her doctor wants her to have a contrasted CT of the neck (which I do not believe is the correct test for this patient) she will require a 13 steroid prep for CT given her contrast allergy.  I believe that TSH T3 T4 testing and ultrasound/nuclear test of the thyroid (not available at [facility]) with follow up with the endocrinologist and ENT surgeon  is the appropriate care for this for this patient.  I will give clindamycin as I cannot feel the lymph nodes below the enlarged gland but there are no nodes above the gland to suggest infection and the patient's strep test is negative.    The patient is nontoxic-appearing on exam and vital signs are within normal limits. I have reviewed the triage vital signs and the nursing notes. Pertinent labs &imaging results that were available during my care of the patient were reviewed by me and considered in my medical decision making (see chart for details). The patient is nontoxic-appearing on exam and vital signs are within normal limits. Return for fevers, chest pain with exertion, shortness of breath, change of voice inability to swallow your saliva, fevers, lethargy, weakness, numbness, neck pain or stiffness, inability to make or understand speech or any concerns.   After history, exam, and medical workup I feel the patient has been appropriately medically screened and is safe for discharge home. Pertinent diagnoses were discussed with the patient. Patient was given return precautions.  All the information above with the names  of follow up physicians and tests you will need is also provided for you on your discharge paperwork, you may take this to your doctors appointments.      Alisha Spagna, MD 09/23/16 [phone]

## 2016-09-23 NOTE — ED Triage Notes (Signed)
c/o swelling to left side of throat swollen area noted w some throat soreness onset 2 days ago, states diff swallowing, no diff talking or breathing,  Cough present

## 2016-09-23 NOTE — Discharge Instructions (Signed)
Need thyroid labs and ultrasound

## 2016-09-25 LAB — CULTURE, GROUP A STREP (THRC)

## 2017-10-06 IMAGING — DX DG KNEE COMPLETE 4+V*L*
4 series · 4 of 4 positions shown · non-contrast
Comparison: None.

CLINICAL DATA: Pain and stiffness in the left knee. No recent
injury reported.

EXAM:
LEFT KNEE - COMPLETE 4+ VIEW

[knee ap]
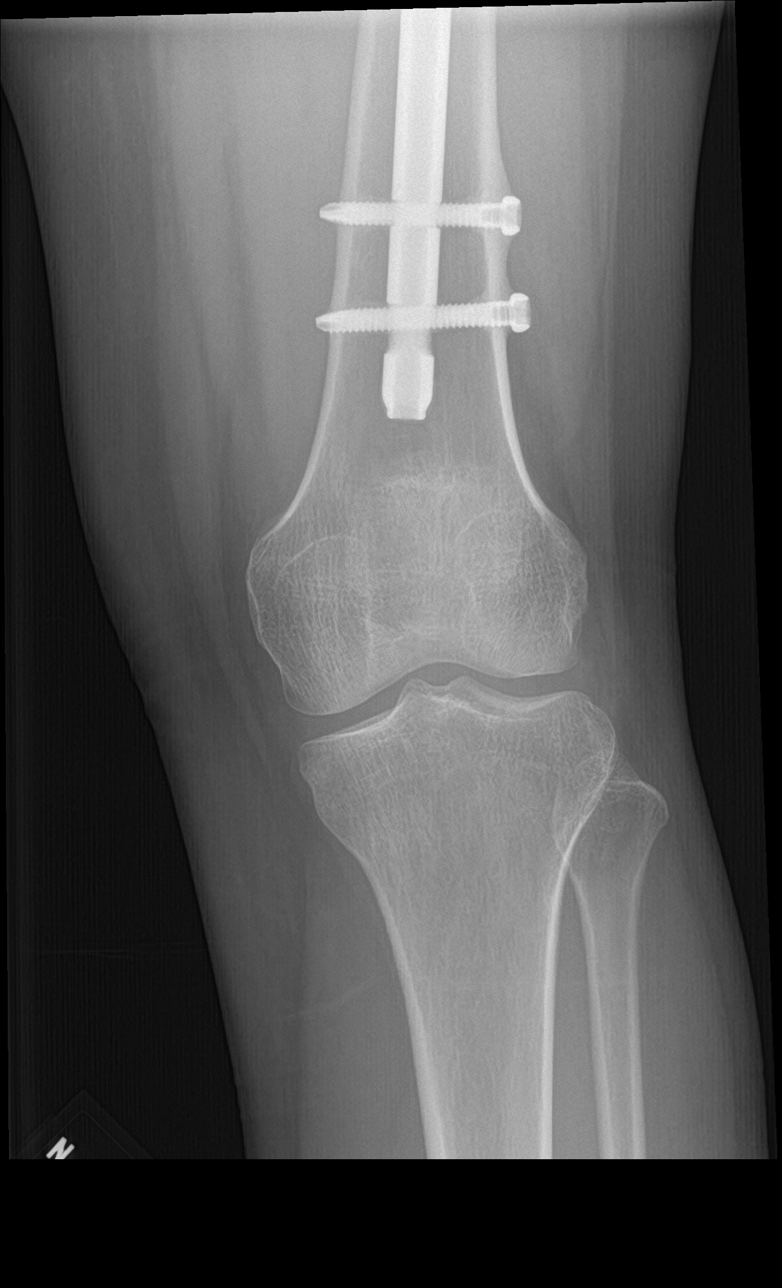

[tunnel]
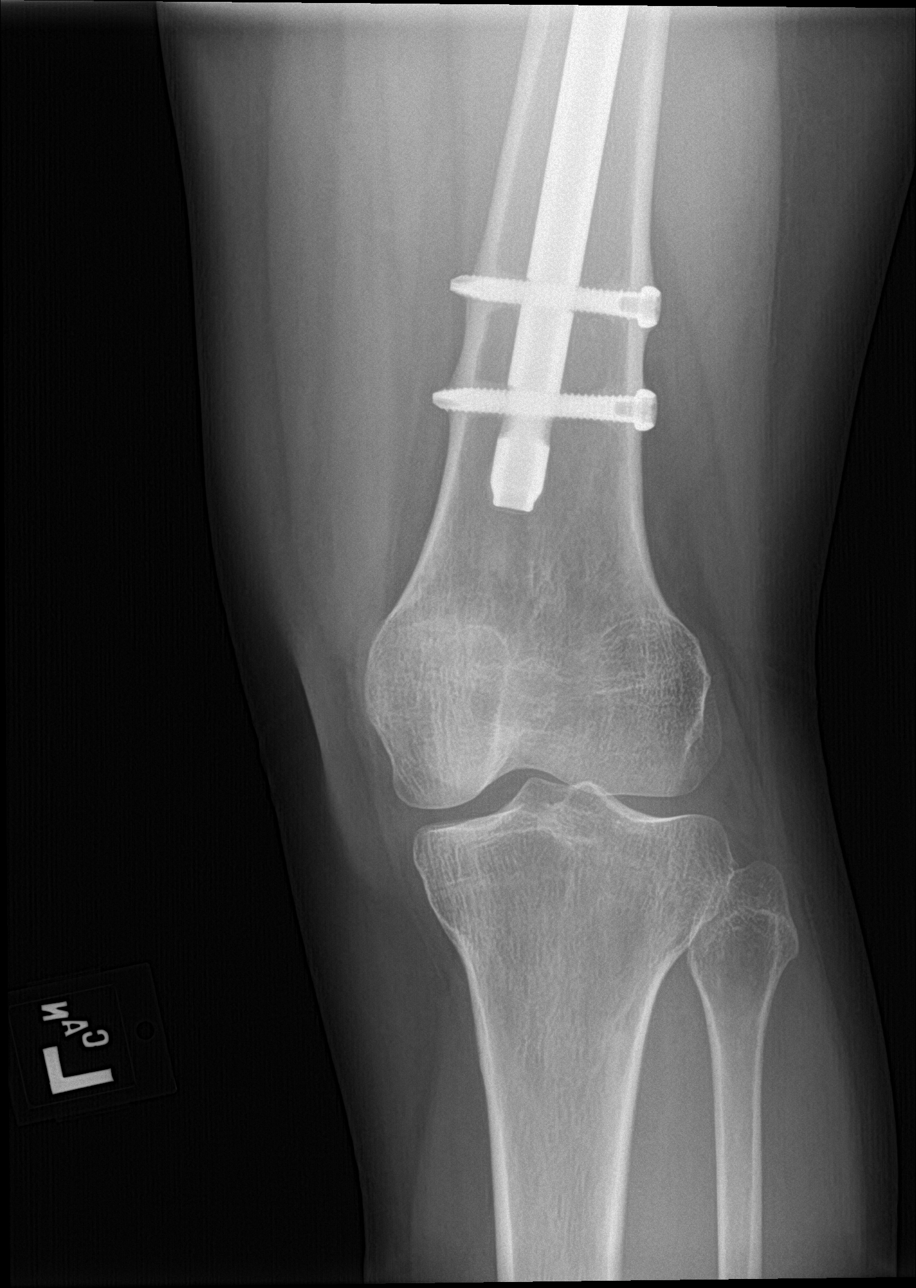

[knee lat]
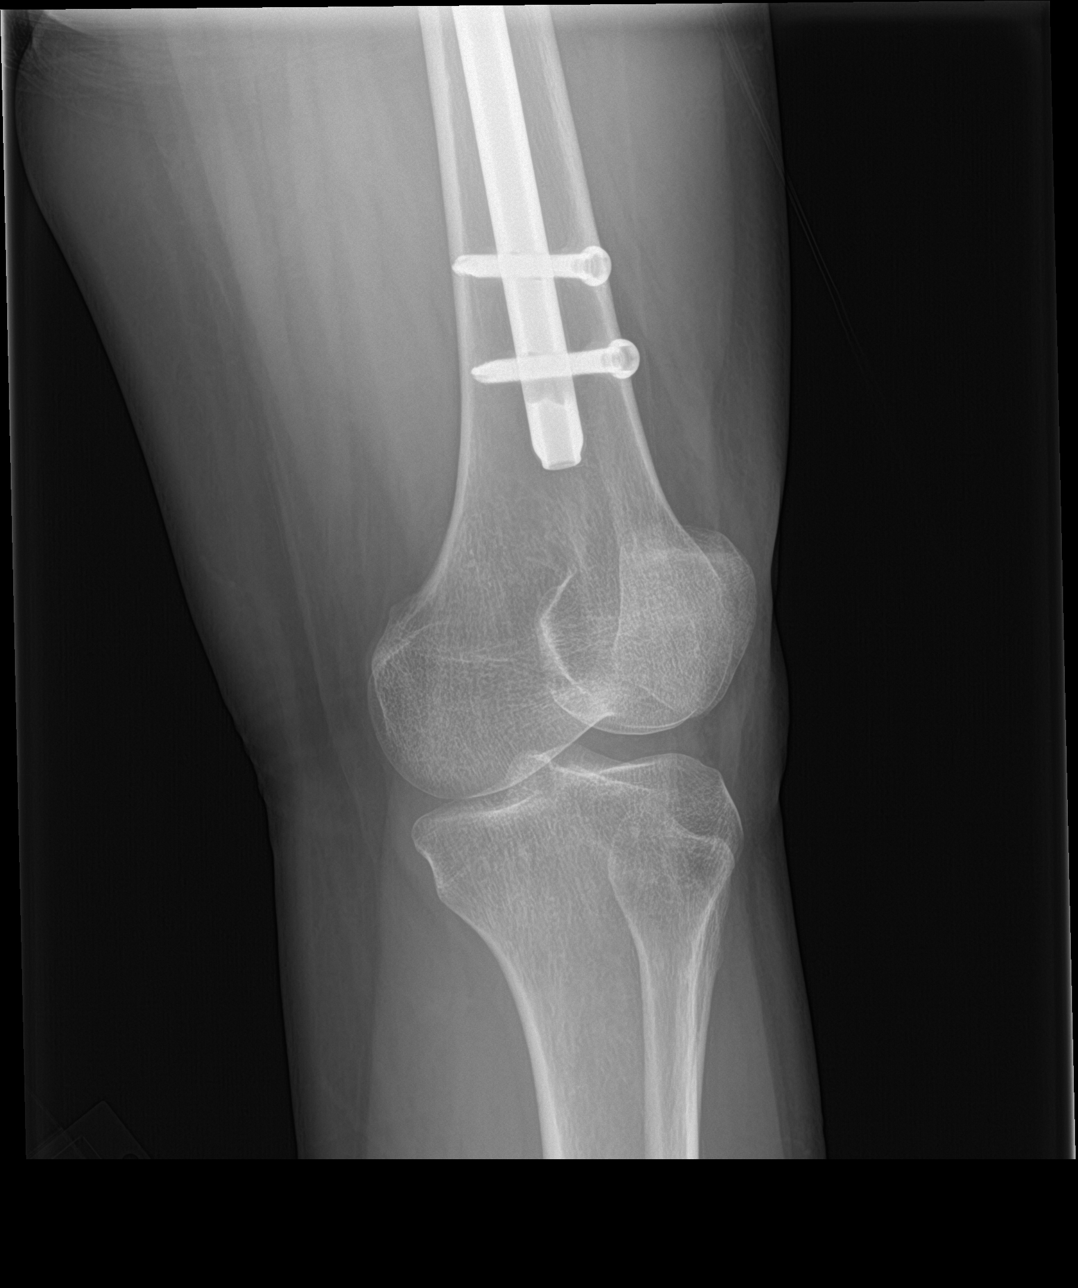

[knee obl]
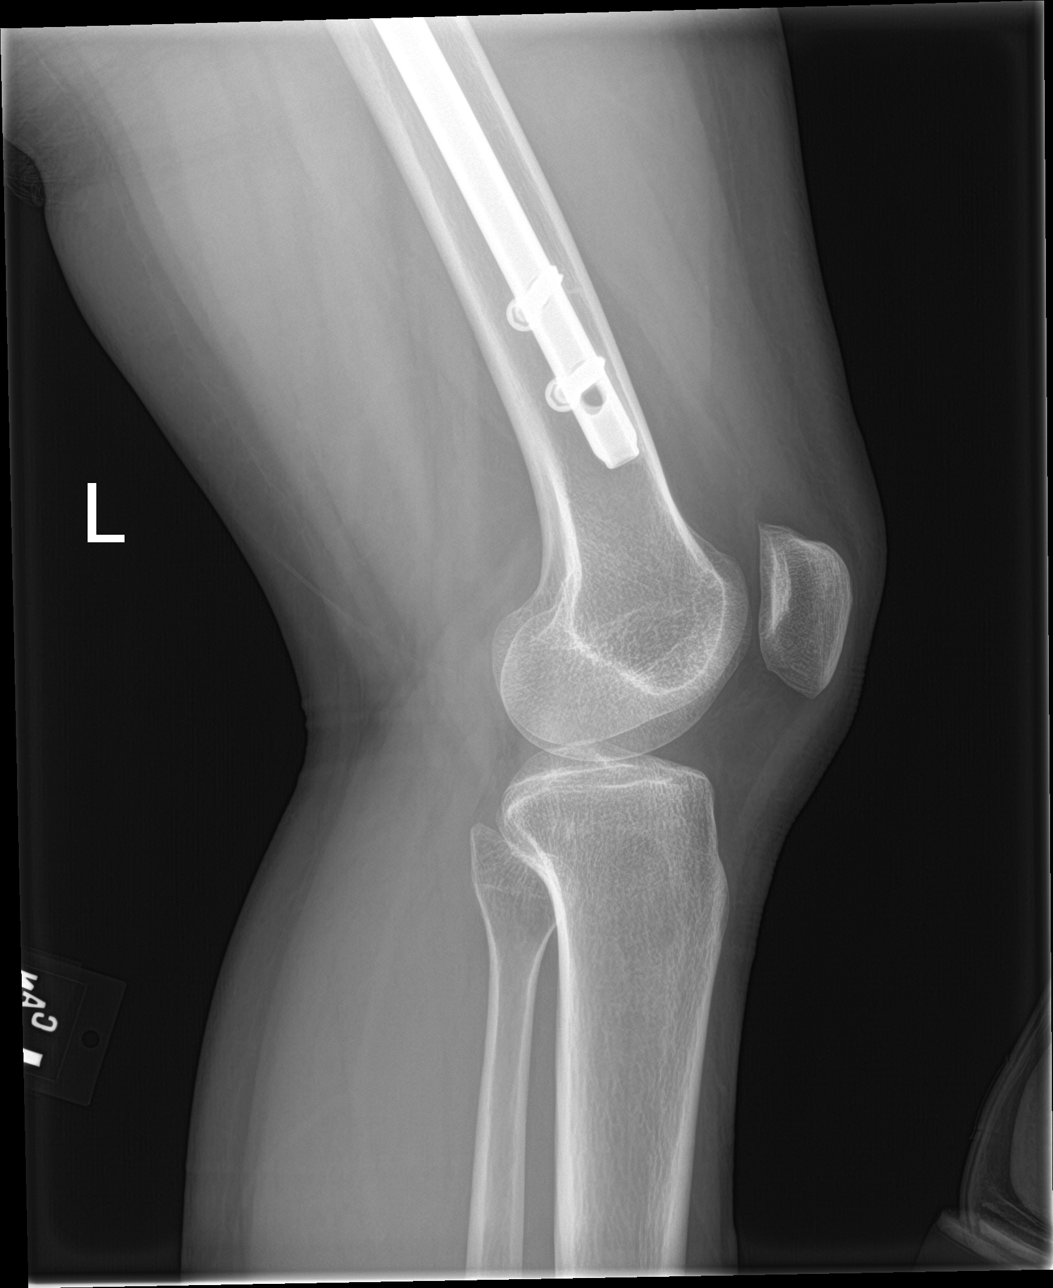

[4 of 4 positions shown; findings below may reference images not displayed]

FINDINGS: Intramedullary rod with distal interlocking screws partially
visualized in the left distal femur, with no evidence of hardware
fracture or loosening. No osseous fracture, joint effusion or
malalignment in the left knee. No suspicious focal osseous lesion.
No appreciable arthropathy.
IMPRESSION: No fracture, joint effusion or malalignment in the left knee.

## 2018-01-29 ENCOUNTER — Encounter (HOSPITAL_BASED_OUTPATIENT_CLINIC_OR_DEPARTMENT_OTHER): Payer: Self-pay

## 2018-01-29 ENCOUNTER — Other Ambulatory Visit: Payer: Self-pay

## 2018-01-29 ENCOUNTER — Emergency Department (HOSPITAL_BASED_OUTPATIENT_CLINIC_OR_DEPARTMENT_OTHER)
Admission: EM | Admit: 2018-01-29 | Discharge: 2018-01-30 | Disposition: A | Discharge status: Home / Self Care | Payer: Medicaid Other | Attending: Emergency Medicine | Admitting: Emergency Medicine

## 2018-01-29 DIAGNOSIS — F17210 Nicotine dependence, cigarettes, uncomplicated: Secondary | ICD-10-CM | POA: Insufficient documentation

## 2018-01-29 DIAGNOSIS — E119 Type 2 diabetes mellitus without complications: Secondary | ICD-10-CM | POA: Insufficient documentation

## 2018-01-29 DIAGNOSIS — E039 Hypothyroidism, unspecified: Secondary | ICD-10-CM | POA: Insufficient documentation

## 2018-01-29 DIAGNOSIS — I10 Essential (primary) hypertension: Secondary | ICD-10-CM | POA: Diagnosis not present

## 2018-01-29 DIAGNOSIS — J029 Acute pharyngitis, unspecified: Secondary | ICD-10-CM | POA: Diagnosis not present

## 2018-01-29 LAB — URINALYSIS, MICROSCOPIC (REFLEX): RBC / HPF: NONE SEEN RBC/hpf (ref 0–5)

## 2018-01-29 LAB — URINALYSIS, ROUTINE W REFLEX MICROSCOPIC
Bilirubin Urine: NEGATIVE
Glucose, UA: NEGATIVE mg/dL
Hgb urine dipstick: NEGATIVE
Ketones, ur: NEGATIVE mg/dL
NITRITE: NEGATIVE
PH: 5.5 (ref 5.0–8.0)
Protein, ur: NEGATIVE mg/dL
SPECIFIC GRAVITY, URINE: 1.025 (ref 1.005–1.030)

## 2018-01-29 LAB — PREGNANCY, URINE: PREG TEST UR: NEGATIVE

## 2018-01-29 LAB — GROUP A STREP BY PCR: GROUP A STREP BY PCR: NOT DETECTED

## 2018-01-29 MED ORDER — IBUPROFEN 800 MG PO TABS
800.0000 mg | ORAL_TABLET | Freq: Once | ORAL | Status: AC
  Administered 2018-01-29: 800 mg via ORAL
  Filled 2018-01-29: qty 1

## 2018-01-29 NOTE — ED Provider Notes (Signed)
TIME SEEN: 11:08 PM  CHIEF COMPLAINT: Sore throat  HPI: Patient is a 27 year old female with history of hypertension, diabetes, hypothyroidism who presents to the emergency department with 2 to 3 days of sore throat, dry cough, diarrhea and some abdominal cramping when having bowel movements.  States she is concerned she could have the flu or strep throat.  No known sick contacts or recent travel.  No nausea or vomiting.  No abdominal pain currently.  Reports subjective fevers.  ROS: See HPI Constitutional: fever  Eyes: no drainage  ENT:  runny nose   Cardiovascular:  no chest pain  Resp: no SOB  GI: no vomiting GU: no dysuria Integumentary: no rash  Allergy: no hives  Musculoskeletal: no leg swelling  Neurological: no slurred speech ROS otherwise negative  PAST MEDICAL HISTORY/PAST SURGICAL HISTORY:  Past Medical History:  Diagnosis Date  . Diabetes mellitus without complication (HCC)   . Hypertension   . Hypothyroid     MEDICATIONS:  Prior to Admission medications   Not on File    ALLERGIES:  Allergies  Allergen Reactions  . Iohexol Anaphylaxis, Hives and Itching    SOCIAL HISTORY:  Social History   Tobacco Use  . Smoking status: Current Every Day Smoker    Packs/day: 1.50    Years: 3.00    Pack years: 4.50    Types: Cigarettes  . Smokeless tobacco: Never Used  Substance Use Topics  . Alcohol use: No    FAMILY HISTORY: No family history on file.  EXAM: BP (!) 142/96 (BP Location: Left Arm)   Pulse (!) 105   Temp 99.6 F (37.6 C) (Oral)   Resp 20   Ht 5\' 2"  (1.575 m)   Wt 58.1 kg   LMP 01/15/2018   SpO2 100%   BMI 23.41 kg/m  CONSTITUTIONAL: Alert and oriented and responds appropriately to questions. Well-appearing; well-nourished HEAD: Normocephalic EYES: Conjunctivae clear, pupils appear equal, EOMI ENT: normal nose; moist mucous membranes; patient has pharyngeal erythema without petechiae.  She has bilateral tonsillar hypertrophy without  exudate.  No uvular deviation, no unilateral swelling, no trismus or drooling, no muffled voice, normal phonation, no stridor, no dental caries present, no drainable dental abscess noted, no Ludwig's angina, tongue sits flat in the bottom of the mouth, no angioedema, no facial erythema or warmth, no facial swelling; no pain with movement of the neck. NECK: Supple, no meningismus, no nuchal rigidity, no LAD  CARD: RRR; S1 and S2 appreciated; no murmurs, no clicks, no rubs, no gallops RESP: Normal chest excursion without splinting or tachypnea; breath sounds clear and equal bilaterally; no wheezes, no rhonchi, no rales, no hypoxia or respiratory distress, speaking full sentences ABD/GI: Normal bowel sounds; non-distended; soft, non-tender, no rebound, no guarding, no peritoneal signs, no hepatosplenomegaly BACK:  The back appears normal and is non-tender to palpation, there is no CVA tenderness EXT: Normal ROM in all joints; non-tender to palpation; no edema; normal capillary refill; no cyanosis, no calf tenderness or swelling    SKIN: Normal color for age and race; warm; no rash NEURO: Moves all extremities equally PSYCH: The patient's mood and manner are appropriate. Grooming and personal hygiene are appropriate.  MEDICAL DECISION MAKING: Patient here with symptoms of pharyngitis.  Abdominal exam benign.  Urine obtained in triage is unremarkable.  Pregnancy test negative.  Will treat symptoms with ibuprofen.  Will obtain strep test.  No signs of PTA, deep space neck infection, pneumonia, meningitis.  She does not appear septic, toxic.  ED PROGRESS: Patient's strep test is negative.  Suspect viral pharyngitis.  Recommended warm salt water gargles, Chloraseptic spray over-the-counter, alternating Tylenol and Motrin.  I feel she is safe to be discharged.  Patient comfortable with this plan.   At this time, I do not feel there is any life-threatening condition present. I have reviewed and discussed all  results (EKG, imaging, lab, urine as appropriate) and exam findings with patient/family. I have reviewed nursing notes and appropriate previous records.  I feel the patient is safe to be discharged home without further emergent workup and can continue workup as an outpatient as needed. Discussed usual and customary return precautions. Patient/family verbalize understanding and are comfortable with this plan.  Outpatient follow-up has been provided if needed. All questions have been answered.      Ward, Layla MawKristen N, DO 01/30/18 0001

## 2018-01-29 NOTE — ED Triage Notes (Signed)
C/o abd pain, diarrhea, chills x today-sore throat x 3 days-NAD-steady gait

## 2018-01-30 NOTE — Discharge Instructions (Addendum)
You may alternate Tylenol 1000 mg every 6 hours as needed for pain and Ibuprofen 800 mg every 8 hours as needed for pain.  Please take Ibuprofen with food.   You may use over-the-counter Chloraseptic spray for sore throat.  You may also gargle warm salt water several times a day to help with sore throat.  You may use over-the-counter Imodium for diarrhea.   Your strep test today was negative.

## 2021-05-10 ENCOUNTER — Emergency Department (HOSPITAL_BASED_OUTPATIENT_CLINIC_OR_DEPARTMENT_OTHER)
Admission: EM | Admit: 2021-05-10 | Discharge: 2021-05-10 | Disposition: A | Discharge status: Home / Self Care | Payer: Medicaid Other | Attending: Emergency Medicine | Admitting: Emergency Medicine

## 2021-05-10 ENCOUNTER — Other Ambulatory Visit: Payer: Self-pay

## 2021-05-10 ENCOUNTER — Encounter (HOSPITAL_BASED_OUTPATIENT_CLINIC_OR_DEPARTMENT_OTHER): Payer: Self-pay | Admitting: *Deleted

## 2021-05-10 DIAGNOSIS — E119 Type 2 diabetes mellitus without complications: Secondary | ICD-10-CM | POA: Diagnosis not present

## 2021-05-10 DIAGNOSIS — Z20822 Contact with and (suspected) exposure to covid-19: Secondary | ICD-10-CM | POA: Insufficient documentation

## 2021-05-10 DIAGNOSIS — E039 Hypothyroidism, unspecified: Secondary | ICD-10-CM | POA: Insufficient documentation

## 2021-05-10 DIAGNOSIS — I10 Essential (primary) hypertension: Secondary | ICD-10-CM | POA: Diagnosis not present

## 2021-05-10 DIAGNOSIS — H9203 Otalgia, bilateral: Secondary | ICD-10-CM | POA: Insufficient documentation

## 2021-05-10 DIAGNOSIS — Z8616 Personal history of COVID-19: Secondary | ICD-10-CM | POA: Diagnosis not present

## 2021-05-10 DIAGNOSIS — F17210 Nicotine dependence, cigarettes, uncomplicated: Secondary | ICD-10-CM | POA: Insufficient documentation

## 2021-05-10 DIAGNOSIS — J111 Influenza due to unidentified influenza virus with other respiratory manifestations: Secondary | ICD-10-CM | POA: Diagnosis not present

## 2021-05-10 DIAGNOSIS — R509 Fever, unspecified: Secondary | ICD-10-CM | POA: Diagnosis present

## 2021-05-10 LAB — GROUP A STREP BY PCR: Group A Strep by PCR: NOT DETECTED

## 2021-05-10 LAB — RESP PANEL BY RT-PCR (FLU A&B, COVID) ARPGX2
Influenza A by PCR: NEGATIVE
Influenza B by PCR: NEGATIVE
SARS Coronavirus 2 by RT PCR: NEGATIVE

## 2021-05-10 NOTE — ED Triage Notes (Signed)
Fever, sore throat, headache since yesterday.

## 2021-05-10 NOTE — ED Provider Notes (Signed)
[facility] EMERGENCY DEPARTMENT Provider Note   CSN: [id number] Arrival date & time: 05/10/21  1346     History Chief Complaint  Patient presents with   URI    Alisha Johnson is a 30 y.o. female with a past medical history of diabetes and hypertension presenting today with complaint of URI symptoms.  She reports that she tested positive for COVID on the 20th and her symptoms have not gone away.  Endorsing congestion, cough, sore throat, headaches, subjective fever and bilateral ear pain.  Works at a health care facility with sick people.  Has only taken aspirin over the past few days for his symptoms.    Past Medical History:  Diagnosis Date   Diabetes mellitus without complication (HCC)    Hypertension    Hypothyroid     Patient Active Problem List   Diagnosis Date Noted   Gonorrhea 06/24/2015   Chlamydia 06/24/2015   Left tubo-ovarian abscess    Tubo-ovarian abscess 06/23/2015   Essential hypertension, benign 06/23/2015   Diabetes (HCC) 06/23/2015    Past Surgical History:  Procedure Laterality Date   FEMUR SURGERY     HIP FRACTURE SURGERY       OB History     Gravida  1   Para      Term      Preterm      AB      Living         SAB      IAB      Ectopic      Multiple      Live Births              No family history on file.  Social History   Tobacco Use   Smoking status: Every Day    Packs/day: 1.50    Years: 3.00    Pack years: 4.50    Types: Cigarettes   Smokeless tobacco: Never  Substance Use Topics   Alcohol use: No   Drug use: Yes    Types: Marijuana    Home Medications Prior to Admission medications   Not on File    Allergies    Iohexol  Review of Systems   Review of Systems  Constitutional:  Positive for chills.  HENT:  Positive for congestion, ear pain and sore throat.   Respiratory:  Positive for cough and chest tightness.   Cardiovascular:  Negative for chest pain and palpitations.   Gastrointestinal:  Negative for diarrhea and vomiting.  Musculoskeletal:  Positive for myalgias.   Physical Exam Updated Vital Signs BP 129/82 (BP Location: Left Arm)    Pulse (!) 101    Temp 98 F (36.7 C) (Oral)    Resp 20    Ht 5\' 2"  (1.575 m)    Wt 70.8 kg    LMP 04/18/2021    SpO2 100%    BMI 28.53 kg/m   Physical Exam Vitals and nursing note reviewed.  Constitutional:      General: She is not in acute distress.    Appearance: Normal appearance. She is not ill-appearing.  HENT:     Head: Normocephalic and atraumatic.     Right Ear: Tympanic membrane normal.     Left Ear: Tympanic membrane normal.     Mouth/Throat:     Mouth: Mucous membranes are moist.     Pharynx: Oropharynx is clear. Posterior oropharyngeal erythema present. No oropharyngeal exudate.  Eyes:     General: No scleral icterus.  Conjunctiva/sclera: Conjunctivae normal.  Cardiovascular:     Rate and Rhythm: Normal rate and regular rhythm.  Pulmonary:     Effort: Pulmonary effort is normal. No respiratory distress.     Breath sounds: No wheezing.  Musculoskeletal:     Cervical back: Normal range of motion.  Lymphadenopathy:     Cervical: No cervical adenopathy.  Skin:    General: Skin is warm and dry.     Findings: No rash.  Neurological:     Mental Status: She is alert.  Psychiatric:        Mood and Affect: Mood normal.    ED Results / Procedures / Treatments   Labs (all labs ordered are listed, but only abnormal results are displayed) Labs Reviewed  RESP PANEL BY RT-PCR (FLU A&B, COVID) ARPGX2  GROUP A STREP BY PCR    EKG None  Radiology No results found.  Procedures Procedures   Medications Ordered in ED Medications - No data to display  ED Course  I have reviewed the triage vital signs and the nursing notes.  Pertinent labs & imaging results that were available during my care of the patient were reviewed by me and considered in my medical decision making (see chart for  details).    MDM Rules/Calculators/A&P 30 year old female presenting with continued COVID symptoms for the past week.  She has not tried any cold or flu medication.  On physical exam, she was not ill-appearing.  No increased respiratory effort.  Throat with erythema however no exudate.  Viral testing negative today.  Lung sounds clear.  Low suspicion for PE.  Originally tachycardic on arrival however no longer Tachycardic.  Low Wells.  We discussed proper over-the-counter care for flulike symptoms.  She is agreeable to discharge with a note for the next couple days. Final Clinical Impression(s) / ED Diagnoses Final diagnoses:  Influenza-like illness    Rx / DC Orders Results and diagnoses were explained to the patient. Return precautions discussed in full. Patient had no additional questions and expressed complete understanding.     Woodroe Chen 05/10/21 1513    Glendora Score, MD 05/10/21 [phone]

## 2021-05-10 NOTE — Discharge Instructions (Signed)
There is a work note attached to these discharge papers.  Mucinex we will treat your congestion.  DayQuil and NyQuil can treat cold symptoms.  Tylenol Cold and flu is also a good option.  Please return with any worsening of your symptoms.  Your primary care provider is another good place to start.

## 2021-05-10 NOTE — ED Notes (Signed)
D/c paperwork reviewed with pt, including provided work note. No questions or concerns at time of d/c. Pt ambulatory to ED exit on RA, NAD, no assistance needed.

## 2022-08-31 ENCOUNTER — Other Ambulatory Visit: Payer: Self-pay

## 2022-08-31 ENCOUNTER — Encounter (HOSPITAL_BASED_OUTPATIENT_CLINIC_OR_DEPARTMENT_OTHER): Payer: Self-pay | Admitting: Emergency Medicine

## 2022-08-31 ENCOUNTER — Emergency Department (HOSPITAL_BASED_OUTPATIENT_CLINIC_OR_DEPARTMENT_OTHER)
Admission: EM | Admit: 2022-08-31 | Discharge: 2022-08-31 | Disposition: A | Discharge status: Home / Self Care | Payer: Medicaid Other | Attending: Emergency Medicine | Admitting: Emergency Medicine

## 2022-08-31 DIAGNOSIS — H66002 Acute suppurative otitis media without spontaneous rupture of ear drum, left ear: Secondary | ICD-10-CM | POA: Diagnosis not present

## 2022-08-31 DIAGNOSIS — O99891 Other specified diseases and conditions complicating pregnancy: Secondary | ICD-10-CM | POA: Diagnosis not present

## 2022-08-31 MED ORDER — AMOXICILLIN 500 MG PO CAPS: 500.0000 mg | ORAL_CAPSULE | Freq: Three times a day (TID) | ORAL | 0 refills | Status: AC

## 2022-08-31 MED ORDER — AMOXICILLIN 500 MG PO CAPS
500.0000 mg | ORAL_CAPSULE | Freq: Once | ORAL | Status: AC
  Administered 2022-08-31: 500 mg via ORAL
  Filled 2022-08-31: qty 1

## 2022-08-31 NOTE — ED Triage Notes (Signed)
Pt states is [redacted] weeks pregnant, earlier had head ache and seeing stars/prisms. 30 mins with pain/pressure/ numbness in left side of head/ear. Was diagnosed with pharyngitis Monday.

## 2022-08-31 NOTE — ED Provider Notes (Signed)
[place] EMERGENCY DEPARTMENT AT [facility] Provider Note   CSN: [id number] Arrival date & time: 08/31/22  0254     History  Chief Complaint  Patient presents with   Ear Fullness   Facial Pain    Alisha Johnson is a 32 y.o. female.  The history is provided by the patient.  Ear Fullness  Alisha Johnson is a 32 y.o. female who presents to the Emergency Department complaining of ear pain.  She presents to the emergency department for evaluation of left ear pain that started today.  It is there all the time but she does have spasms that are much worse and she feels more swollen in the left side of her face.  Pain is worse with belching and yawning.  She did have a brief episode of seeing stars earlier today but this has since resolved.  She is currently [redacted] weeks pregnant.  She does have a history of placenta previa.  No contractions or leakage of fluids.     Home Medications Prior to Admission medications   Medication Sig Start Date End Date Taking? Authorizing Provider  amoxicillin (AMOXIL) 500 MG capsule Take 1 capsule (500 mg total) by mouth 3 (three) times daily. 08/31/22  Yes Tilden Fossa, MD      Allergies    Iohexol    Review of Systems   Review of Systems  All other systems reviewed and are negative.   Physical Exam Updated Vital Signs BP 123/79 (BP Location: Right Arm)   Pulse 95   Temp 98.6 F (37 C) (Oral)   Resp 20   Ht  (1.651 m)   Wt 65.8 kg   SpO2 100%   BMI 24.13 kg/m  Physical Exam Vitals and nursing note reviewed.  Constitutional:      Appearance: Normal appearance.  HENT:     Head: Normocephalic and atraumatic.     Comments: Left TM is dull with erythema and bulging.  Right TM is within normal limits.  There is no significant mastoid swelling.  There is no soft tissue swelling to the parotid region.  No erythema in the posterior oropharynx. Cardiovascular:     Rate and Rhythm: Normal rate and regular rhythm.   Pulmonary:     Effort: Pulmonary effort is normal. No respiratory distress.  Abdominal:     Comments: Gravid uterus  Musculoskeletal:        General: No swelling.     Cervical back: Neck supple.  Skin:    Capillary Refill: Capillary refill takes less than 2 seconds.  Neurological:     General: No focal deficit present.     Mental Status: She is alert and oriented to person, place, and time.  Psychiatric:        Mood and Affect: Mood normal.        Behavior: Behavior normal.     ED Results / Procedures / Treatments   Labs (all labs ordered are listed, but only abnormal results are displayed) Labs Reviewed - No data to display  EKG None  Radiology No results found.  Procedures Procedures    Medications Ordered in ED Medications  amoxicillin (AMOXIL) capsule 500 mg (has no administration in time range)    ED Course/ Medical Decision Making/ A&P                             Medical Decision Making  Pt [redacted] weeks pregnant here for  evaluation of left ear pain.  She does have otitis media on examination.  No evidence of mastoiditis.  She did have some kaleidoscope vision changes briefly for about 30 minutes around 11 AM , these have since resolved -unclear the relevance to her current ear infection.  Current clinical picture is not consistent with sepsis, meningitis, preeclampsia.  Will start antibiotics with outpatient follow-up and return precautions..       Final Clinical Impression(s) / ED Diagnoses Final diagnoses:  Non-recurrent acute suppurative otitis media of left ear without spontaneous rupture of tympanic membrane    Rx / DC Orders ED Discharge Orders          Ordered    amoxicillin (AMOXIL) 500 MG capsule  3 times daily        08/31/22 0556              Tilden Fossa, MD 08/31/22 [phone]

## 2022-10-31 ENCOUNTER — Ambulatory Visit: Payer: Medicaid Other | Admitting: Family Medicine

## 2022-12-09 ENCOUNTER — Emergency Department (HOSPITAL_BASED_OUTPATIENT_CLINIC_OR_DEPARTMENT_OTHER)
Admission: EM | Admit: 2022-12-09 | Discharge: 2022-12-09 | Disposition: A | Discharge status: Home / Self Care | Payer: Medicaid Other | Attending: Emergency Medicine | Admitting: Emergency Medicine

## 2022-12-09 ENCOUNTER — Encounter (HOSPITAL_BASED_OUTPATIENT_CLINIC_OR_DEPARTMENT_OTHER): Payer: Self-pay | Admitting: Emergency Medicine

## 2022-12-09 DIAGNOSIS — S61512A Laceration without foreign body of left wrist, initial encounter: Secondary | ICD-10-CM | POA: Diagnosis not present

## 2022-12-09 DIAGNOSIS — W25XXXA Contact with sharp glass, initial encounter: Secondary | ICD-10-CM | POA: Diagnosis not present

## 2022-12-09 DIAGNOSIS — S6992XA Unspecified injury of left wrist, hand and finger(s), initial encounter: Secondary | ICD-10-CM | POA: Diagnosis present

## 2022-12-09 MED ORDER — LIDOCAINE HCL (PF) 1 % IJ SOLN
5.0000 mL | Freq: Once | INTRAMUSCULAR | Status: AC
  Administered 2022-12-09: 5 mL
  Filled 2022-12-09: qty 5

## 2022-12-09 NOTE — Discharge Instructions (Signed)
It was a pleasure taking care of you today!   You may return to urgent care or return to the emergency department for suture removal in 7-10 days.  Keep the area clean and dry.  You will be provided with a brace to keep the sutures from opening up.  It is recommended that she wear the brace until the sutures are removed.  Return to the emergency department if worsening or persistent pain, drainage of wound, increased swelling, or color change to area.
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
## 2022-12-09 NOTE — ED Notes (Incomplete)
Antiobotic ointment

## 2023-11-13 ENCOUNTER — Other Ambulatory Visit: Payer: Self-pay

## 2023-11-13 ENCOUNTER — Encounter (HOSPITAL_BASED_OUTPATIENT_CLINIC_OR_DEPARTMENT_OTHER): Payer: Self-pay

## 2023-11-13 ENCOUNTER — Emergency Department (HOSPITAL_BASED_OUTPATIENT_CLINIC_OR_DEPARTMENT_OTHER)

## 2023-11-13 ENCOUNTER — Emergency Department (HOSPITAL_BASED_OUTPATIENT_CLINIC_OR_DEPARTMENT_OTHER)
Admission: EM | Admit: 2023-11-13 | Discharge: 2023-11-13 | Disposition: A | Discharge status: Home / Self Care | Attending: Emergency Medicine | Admitting: Emergency Medicine

## 2023-11-13 DIAGNOSIS — H6692 Otitis media, unspecified, left ear: Secondary | ICD-10-CM | POA: Diagnosis not present

## 2023-11-13 DIAGNOSIS — J029 Acute pharyngitis, unspecified: Secondary | ICD-10-CM | POA: Diagnosis not present

## 2023-11-13 DIAGNOSIS — R059 Cough, unspecified: Secondary | ICD-10-CM | POA: Diagnosis not present

## 2023-11-13 DIAGNOSIS — R509 Fever, unspecified: Secondary | ICD-10-CM | POA: Diagnosis present

## 2023-11-13 DIAGNOSIS — H6690 Otitis media, unspecified, unspecified ear: Secondary | ICD-10-CM

## 2023-11-13 LAB — RESP PANEL BY RT-PCR (RSV, FLU A&B, COVID)  RVPGX2
Influenza A by PCR: NEGATIVE
Influenza B by PCR: NEGATIVE
Resp Syncytial Virus by PCR: NEGATIVE
SARS Coronavirus 2 by RT PCR: NEGATIVE

## 2023-11-13 LAB — GROUP A STREP BY PCR: Group A Strep by PCR: NOT DETECTED

## 2023-11-13 LAB — PREGNANCY, URINE: Preg Test, Ur: NEGATIVE

## 2023-11-13 MED ORDER — AMOXICILLIN-POT CLAVULANATE 875-125 MG PO TABS
1.0000 | ORAL_TABLET | Freq: Once | ORAL | Status: AC
  Administered 2023-11-13: 1 via ORAL
  Filled 2023-11-13: qty 1

## 2023-11-13 MED ORDER — AMOXICILLIN-POT CLAVULANATE 875-125 MG PO TABS: 1.0000 | ORAL_TABLET | Freq: Two times a day (BID) | ORAL | 0 refills | Status: AC

## 2023-11-13 NOTE — ED Provider Notes (Signed)
 [place] [facility] Provider Note   CSN: [id number] Arrival date & time: 11/13/23  2145     Patient presents with: Sore Throat   Alisha Johnson  is a 33 y.o. female.  {Add pertinent medical, surgical, social history, OB history to HPI:7361} 33 year old female who presents emergency department with a sore throat, subjective fever, and ear pain.  Symptoms started yesterday.  Has not actually taken her temperature.  Also has had some congestion and a cough.       Prior to Admission medications   Medication Sig Start Date End Date Taking? Authorizing Provider  amoxicillin  (AMOXIL ) 500 MG capsule Take 1 capsule (500 mg total) by mouth 3 (three) times daily. 08/31/22   Griselda Norris, MD    Allergies: Iohexol     Review of Systems  Updated Vital Signs BP (!) 134/94 (BP Location: Left Arm)   Pulse (!) 46   Temp 98.6 F (37 C)   Resp 20   Ht 5' 2 (1.575 m)   Wt 61.2 kg   LMP 11/11/2023 (Approximate)   SpO2 96%   Breastfeeding No   BMI 24.69 kg/m   Physical Exam Vitals and nursing note reviewed.  Constitutional:      General: She is not in acute distress.    Appearance: She is well-developed.  HENT:     Head: Normocephalic and atraumatic.     Right Ear: Tympanic membrane, ear canal and external ear normal.     Left Ear: Ear canal and external ear normal.     Ears:     Comments: Bulging and erythematous left TM    Nose: Nose normal.     Mouth/Throat:     Mouth: Mucous membranes are moist.     Pharynx: Posterior oropharyngeal erythema present. No oropharyngeal exudate.  Eyes:     Extraocular Movements: Extraocular movements intact.     Conjunctiva/sclera: Conjunctivae normal.     Pupils: Pupils are equal, round, and reactive to light.  Cardiovascular:     Rate and Rhythm: Normal rate and regular rhythm.     Heart sounds: No murmur heard. Pulmonary:     Effort: Pulmonary effort is normal. No respiratory distress.      Breath sounds: Normal breath sounds.  Musculoskeletal:     Cervical back: Normal range of motion and neck supple.     Right lower leg: No edema.     Left lower leg: No edema.  Lymphadenopathy:     Cervical: Cervical adenopathy present.  Skin:    General: Skin is warm and dry.  Neurological:     Mental Status: She is alert and oriented to person, place, and time. Mental status is at baseline.  Psychiatric:        Mood and Affect: Mood normal.     (all labs ordered are listed, but only abnormal results are displayed) Labs Reviewed  RESP PANEL BY RT-PCR (RSV, FLU A&B, COVID)  [id number]  GROUP A STREP BY PCR    EKG: None  Radiology: DG Chest 2 View Result Date: 11/13/2023 CLINICAL DATA:  Cough for 3 months. EXAM: CHEST - 2 VIEW COMPARISON:  06/13/2023 FINDINGS: The cardiomediastinal contours are normal. Minimal bronchial thickening. Pulmonary vasculature is normal. No consolidation, pleural effusion, or pneumothorax. No acute osseous abnormalities are seen. IMPRESSION: Minimal bronchial thickening. Electronically Signed   By: Andrea Gasman M.D.   On: 11/13/2023 22:15    {Document cardiac monitor, telemetry assessment procedure when appropriate:32947} Procedures   Medications  Ordered in the ED - No data to display    {Click here for ABCD2, HEART and other calculators REFRESH Note before signing:1}                              Medical Decision Making Amount and/or Complexity of Data Reviewed Labs: ordered. Radiology: ordered.  Risk Prescription drug management.   ***  {Document critical care time when appropriate  Document review of labs and clinical decision tools ie CHADS2VASC2, etc  Document your independent review of radiology images and any outside records  Document your discussion with family members, caretakers and with consultants  Document social determinants of health affecting pt's care  Document your decision making why or why not admission, treatments were  needed:32947:::1}   Final diagnoses:  None    ED Discharge Orders     None

## 2023-11-13 NOTE — ED Triage Notes (Signed)
 Pt presents via POV c/o sore thorat, left ear pain, and headache x2 days. Also reports cough x3 months. Reports productive cough. Ambulatory to triage. A&O x4.

## 2023-11-13 NOTE — Discharge Instructions (Addendum)
 You were seen for your ear infection and throat infection in the emergency department.   At home, please take the antibiotics we have prescribed you.    Check your MyChart online for the results of any tests that had not resulted by the time you left the emergency department.   Follow-up with your primary doctor in 2-3 days regarding your visit.    Return immediately to the emergency department if you experience any of the following: Worsening pain, difficulty breathing, or any other concerning symptoms.    Thank you for visiting our Emergency Department. It was a pleasure taking care of you today.

## 2024-03-09 ENCOUNTER — Emergency Department (HOSPITAL_BASED_OUTPATIENT_CLINIC_OR_DEPARTMENT_OTHER)

## 2024-03-09 ENCOUNTER — Other Ambulatory Visit: Payer: Self-pay

## 2024-03-09 ENCOUNTER — Emergency Department (HOSPITAL_BASED_OUTPATIENT_CLINIC_OR_DEPARTMENT_OTHER): Admission: EM | Admit: 2024-03-09 | Discharge: 2024-03-09 | Disposition: A | Discharge status: Home / Self Care

## 2024-03-09 ENCOUNTER — Encounter (HOSPITAL_BASED_OUTPATIENT_CLINIC_OR_DEPARTMENT_OTHER): Payer: Self-pay | Admitting: Emergency Medicine

## 2024-03-09 DIAGNOSIS — O209 Hemorrhage in early pregnancy, unspecified: Secondary | ICD-10-CM | POA: Insufficient documentation

## 2024-03-09 DIAGNOSIS — Z3A01 Less than 8 weeks gestation of pregnancy: Secondary | ICD-10-CM | POA: Insufficient documentation

## 2024-03-09 DIAGNOSIS — O4690 Antepartum hemorrhage, unspecified, unspecified trimester: Secondary | ICD-10-CM

## 2024-03-09 LAB — URINALYSIS, MICROSCOPIC (REFLEX)

## 2024-03-09 LAB — CBC WITH DIFFERENTIAL/PLATELET
Abs Immature Granulocytes: 0.02 K/uL (ref 0.00–0.07)
Basophils Absolute: 0 K/uL (ref 0.0–0.1)
Basophils Relative: 1 %
Eosinophils Absolute: 0.1 K/uL (ref 0.0–0.5)
Eosinophils Relative: 1 %
HCT: 38.5 % (ref 36.0–46.0)
Hemoglobin: 12.5 g/dL (ref 12.0–15.0)
Immature Granulocytes: 0 %
Lymphocytes Relative: 32 %
Lymphs Abs: 2.8 K/uL (ref 0.7–4.0)
MCH: 27.8 pg (ref 26.0–34.0)
MCHC: 32.5 g/dL (ref 30.0–36.0)
MCV: 85.7 fL (ref 80.0–100.0)
Monocytes Absolute: 0.4 K/uL (ref 0.1–1.0)
Monocytes Relative: 5 %
Neutro Abs: 5.3 K/uL (ref 1.7–7.7)
Neutrophils Relative %: 61 %
Platelets: 340 K/uL (ref 150–400)
RBC: 4.49 MIL/uL (ref 3.87–5.11)
RDW: 15.2 % (ref 11.5–15.5)
WBC: 8.8 K/uL (ref 4.0–10.5)
nRBC: 0 % (ref 0.0–0.2)

## 2024-03-09 LAB — URINALYSIS, ROUTINE W REFLEX MICROSCOPIC
Bilirubin Urine: NEGATIVE
Glucose, UA: NEGATIVE mg/dL
Ketones, ur: 15 mg/dL — AB
Leukocytes,Ua: NEGATIVE
Nitrite: NEGATIVE
Protein, ur: 30 mg/dL — AB
Specific Gravity, Urine: 1.02 (ref 1.005–1.030)
pH: 7.5 (ref 5.0–8.0)

## 2024-03-09 LAB — HCG, QUANTITATIVE, PREGNANCY: hCG, Beta Chain, Quant, S: 37901 m[IU]/mL — ABNORMAL HIGH (ref <5)

## 2024-03-09 NOTE — ED Provider Notes (Signed)
 [place] [facility] Provider Note   CSN: [id number] Arrival date & time: 03/09/24  1405     Patient presents with: Vaginal Bleeding   Alisha Johnson  is a 33 y.o. female.   This is a 33 year old female presenting the emergency department for vaginal bleeding in the setting of positive home pregnancy test.  She is a G6 with 4 children and 1 prior miscarriage.  Had intercourse this morning and had some bleeding when she wiped afterwards and then several hours later felt some more bleeding.  Had a little bit of cramping on her left side that is since resolved.  Not having any pain.  No lightheadedness dizziness.   Vaginal Bleeding      Prior to Admission medications   Medication Sig Start Date End Date Taking? Authorizing Provider  amoxicillin  (AMOXIL ) 500 MG capsule Take 1 capsule (500 mg total) by mouth 3 (three) times daily. 08/31/22   Griselda Norris, MD  amoxicillin -clavulanate (AUGMENTIN ) 875-125 MG tablet Take 1 tablet by mouth every 12 (twelve) hours. 11/13/23   Yolande Lamar BROCKS, MD    Allergies: Iohexol     Review of Systems  Genitourinary:  Positive for vaginal bleeding.    Updated Vital Signs BP 121/80 (BP Location: Right Arm)   Pulse 97   Temp 98.9 F (37.2 C) (Oral)   Resp 16   Ht 5' 2 (1.575 m)   Wt 62.1 kg   LMP 02/20/2024   SpO2 100%   BMI 25.06 kg/m   Physical Exam Vitals and nursing note reviewed.  Constitutional:      General: She is not in acute distress.    Appearance: She is not toxic-appearing.  HENT:     Head: Normocephalic.     Nose: Nose normal.     Mouth/Throat:     Mouth: Mucous membranes are moist.  Eyes:     Conjunctiva/sclera: Conjunctivae normal.  Cardiovascular:     Rate and Rhythm: Normal rate and regular rhythm.  Pulmonary:     Effort: Pulmonary effort is normal.  Abdominal:     General: Abdomen is flat. There is no distension.     Tenderness: There is no abdominal tenderness.  There is no guarding or rebound.  Genitourinary:    Comments: Deferred due to patient preference Musculoskeletal:        General: Normal range of motion.  Skin:    General: Skin is warm and dry.     Capillary Refill: Capillary refill takes less than 2 seconds.  Neurological:     Mental Status: She is oriented to person, place, and time.  Psychiatric:        Mood and Affect: Mood normal.        Behavior: Behavior normal.     (all labs ordered are listed, but only abnormal results are displayed) Labs Reviewed  HCG, QUANTITATIVE, PREGNANCY - Abnormal; Notable for the following components:      Result Value   hCG, Beta Chain, Quant, S 37,901 (*)    All other components within normal limits  URINALYSIS, ROUTINE W REFLEX MICROSCOPIC - Abnormal; Notable for the following components:   APPearance HAZY (*)    Hgb urine dipstick TRACE (*)    Ketones, ur 15 (*)    Protein, ur 30 (*)    All other components within normal limits  URINALYSIS, MICROSCOPIC (REFLEX) - Abnormal; Notable for the following components:   Bacteria, UA RARE (*)    All other components within normal  limits  CBC WITH DIFFERENTIAL/PLATELET    EKG: None  Radiology: US  OB LESS THAN 14 WEEKS WITH OB TRANSVAGINAL Result Date: 03/09/2024 CLINICAL DATA:  Pregnant patient with vaginal bleeding. EXAM: OBSTETRIC <14 WK US  AND TRANSVAGINAL OB US  TECHNIQUE: Both transabdominal and transvaginal ultrasound examinations were performed for complete evaluation of the gestation as well as the maternal uterus, adnexal regions, and pelvic cul-de-sac. Transvaginal technique was performed to assess early pregnancy. COMPARISON:  None Available. FINDINGS: Intrauterine gestational sac: Single Yolk sac:  Visualized. Embryo:  Visualized. Cardiac Activity: Visualized. Heart Rate: 106 bpm CRL:  2.5 mm   5 w   6 d                  US  EDC: 11/03/2024 Subchorionic hemorrhage:  Trace. Maternal uterus/adnexae: Both ovaries are visualized and are  normal. Ovarian blood flow is demonstrated. No adnexal mass. No pelvic free fluid. IMPRESSION: 1. Single live intrauterine pregnancy estimated gestational age based on crown-rump length 5 weeks 6 days for ultrasound [facility] 11/03/2024. 2. Borderline fetal bradycardia is likely due to early gestational age. Recommend close clinical and sonographic follow-up. 3. Minimal subchorionic hemorrhage. Electronically Signed   By: Andrea Gasman M.D.   On: 03/09/2024 17:20     Procedures   Medications Ordered in the ED - No data to display  Clinical Course as of 03/09/24 1739  Sun Mar 09, 2024  1734 US  OB LESS THAN 14 WEEKS WITH OB TRANSVAGINAL IMPRESSION: 1. Single live intrauterine pregnancy estimated gestational age based on crown-rump length 5 weeks 6 days for ultrasound [facility] 11/03/2024. 2. Borderline fetal bradycardia is likely due to early gestational age. Recommend close clinical and sonographic follow-up. 3. Minimal subchorionic hemorrhage.   Electronically Signed   [TY]    Clinical Course User Index [TY] Neysa Caron PARAS, DO                                 Medical Decision Making Is a 33 year old female presenting to the emergency department for vaginal bleeding in the setting of positive home pregnancy test.  Last missed period was September 7.  Seems to have minor bleeding and not having any abdominal pain.  She is afebrile nontachycardic hemodynamically stable.  Benign abdominal exam.  Screening labs with normal hemoglobin hCG level 37,000.  Follow-up ultrasound as noted in the ED course.  Patient made aware of ultrasound findings and encouraged to follow-up closely with her OB/GYN.  Was given strict return precautions.  Will discharge in stable condition.  Amount and/or Complexity of Data Reviewed External Data Reviewed:     Details: Not on blood thinner Labs: ordered. Decision-making details documented in ED Course. Radiology: ordered and independent interpretation performed.  Decision-making details documented in ED Course.    Details: Fetal heart rate 100s, which is low.  Risk Decision regarding hospitalization. Diagnosis or treatment significantly limited by social determinants of health. Risk Details: Poor health literacy       Final diagnoses:  None    ED Discharge Orders     None          Neysa Caron PARAS, DO 03/09/24 1739

## 2024-03-09 NOTE — ED Triage Notes (Signed)
 Pt reports vaginal bleeding and mild pelvic cramping; had several positive home preg tests

## 2024-03-09 NOTE — Discharge Instructions (Signed)
 Please follow-up with your OB/GYN soon as possible.  Return emergency department if you develop fevers, chills, severe headache, chest pain, difficulty breathing, severe abdominal pain, heavy vaginal bleeding more than 2 pads an hour for 3 hours, lightheadedness, passout or he develop any new or worsening symptoms that are concerning to you.

## 2024-03-09 NOTE — ED Notes (Signed)
 Pt is getting into a gown. RN provided warm blanket for comfort.
